# Patient Record
Sex: Female | Born: 1980 | Race: Black or African American | Hispanic: No | Marital: Married | Smoking: Never smoker
Health system: Southern US, Community
[De-identification: ages and names within clinical notes are randomized; demographics above are authoritative.]

## PROBLEM LIST (undated history)

## (undated) DIAGNOSIS — E0590 Thyrotoxicosis, unspecified without thyrotoxic crisis or storm: Secondary | ICD-10-CM

## (undated) DIAGNOSIS — M940 Chondrocostal junction syndrome [Tietze]: Secondary | ICD-10-CM

## (undated) DIAGNOSIS — M1990 Unspecified osteoarthritis, unspecified site: Secondary | ICD-10-CM

## (undated) DIAGNOSIS — E663 Overweight: Secondary | ICD-10-CM

## (undated) DIAGNOSIS — I471 Supraventricular tachycardia: Secondary | ICD-10-CM

## (undated) DIAGNOSIS — Z6827 Body mass index (BMI) 27.0-27.9, adult: Secondary | ICD-10-CM

## (undated) DIAGNOSIS — I4719 Other supraventricular tachycardia: Secondary | ICD-10-CM

## (undated) DIAGNOSIS — J45909 Unspecified asthma, uncomplicated: Secondary | ICD-10-CM

## (undated) DIAGNOSIS — R079 Chest pain, unspecified: Secondary | ICD-10-CM

## (undated) HISTORY — DX: Chest pain, unspecified: R07.9

## (undated) HISTORY — DX: Chondrocostal junction syndrome (tietze): M94.0

## (undated) HISTORY — DX: Supraventricular tachycardia: I47.1

## (undated) HISTORY — DX: Overweight: E66.3

## (undated) HISTORY — DX: Unspecified asthma, uncomplicated: J45.909

## (undated) HISTORY — DX: Body mass index (BMI) 27.0-27.9, adult: Z68.27

## (undated) HISTORY — DX: Other supraventricular tachycardia: I47.19

---

## 2002-11-11 ENCOUNTER — Ambulatory Visit (HOSPITAL_COMMUNITY): Admission: RE | Admit: 2002-11-11 | Discharge: 2002-11-11 | Payer: Self-pay | Admitting: Family Medicine

## 2002-11-11 ENCOUNTER — Encounter: Payer: Self-pay | Admitting: Family Medicine

## 2006-09-07 ENCOUNTER — Ambulatory Visit: Payer: Self-pay | Admitting: Urgent Care

## 2006-09-11 ENCOUNTER — Ambulatory Visit: Payer: Self-pay | Admitting: Gastroenterology

## 2006-10-23 ENCOUNTER — Ambulatory Visit: Payer: Self-pay | Admitting: Internal Medicine

## 2007-03-07 ENCOUNTER — Other Ambulatory Visit: Admission: RE | Admit: 2007-03-07 | Discharge: 2007-03-07 | Payer: Self-pay | Admitting: Obstetrics and Gynecology

## 2007-10-15 ENCOUNTER — Other Ambulatory Visit: Admission: RE | Admit: 2007-10-15 | Discharge: 2007-10-15 | Payer: Self-pay | Admitting: Obstetrics and Gynecology

## 2008-11-25 ENCOUNTER — Other Ambulatory Visit: Admission: RE | Admit: 2008-11-25 | Discharge: 2008-11-25 | Payer: Self-pay | Admitting: Obstetrics & Gynecology

## 2009-06-25 ENCOUNTER — Emergency Department (HOSPITAL_COMMUNITY): Admission: EM | Admit: 2009-06-25 | Discharge: 2009-06-25 | Payer: Self-pay | Admitting: Emergency Medicine

## 2009-11-30 ENCOUNTER — Ambulatory Visit (HOSPITAL_COMMUNITY): Admission: RE | Admit: 2009-11-30 | Discharge: 2009-11-30 | Payer: Self-pay | Admitting: Obstetrics & Gynecology

## 2009-11-30 ENCOUNTER — Other Ambulatory Visit: Admission: RE | Admit: 2009-11-30 | Discharge: 2009-11-30 | Payer: Self-pay | Admitting: Obstetrics and Gynecology

## 2010-02-23 LAB — HIV ANTIBODY (ROUTINE TESTING W REFLEX): HIV: NONREACTIVE

## 2010-04-19 LAB — GLUCOSE, CAPILLARY: Glucose-Capillary: 122 mg/dL — ABNORMAL HIGH (ref 70–99)

## 2010-06-15 NOTE — Assessment & Plan Note (Signed)
NAME:  Christy Harmon, Christy Harmon             CHART#:  [id number]   DATE:  09/07/2006                       DOB:  21-May-1980   REQUESTING PHYSICIAN:  Madelin Rear. Sherwood Gambler, M.D.   CHIEF COMPLAINT:  Abdominal pain/constipation.   HISTORY OF PRESENT ILLNESS:  Christy Harmon is a 30 year old female who  developed constipation about two months ago.  She tells me she went up  to a week without a bowel movement.  She was seen by Dr. Sherwood Gambler.  Was  started on stool softeners as well as increasing her intake of juice.  She tells me this has gotten much better.  She is now only going up to  every other day without a bowel movement.  She denies any rectal  bleeding or melena.  She does have tenderness to her entire abdomen.  She has intermittent twisting pain that she rates as a 6/10.  This is  usually intermittent.  It lasts until she eats.  She feels the pain is  relieved once she eats, then it returns in several hours.  She complains  of nausea but denies any vomiting.  She does have a history of heartburn  and indigestion.  She was having some odynophagia.  She resumed her  AcipHex 20 mg daily, and this seemed to resolve her GERD symptoms.  She  tells me occasionally she has loose stools after she eats where she has  to run to the bathroom.  Her weight is up 16 pounds in the last four  months.  She denies any dysphagia.   PAST MEDICAL/SURGICAL HISTORY:  Chronic GERD, seasonal allergies.   CURRENT MEDICATIONS:  1. AcipHex 20 mg daily.  2. Birth control pills, Ortho Tri-Cyclen once daily.  3. Stool softener daily.  4. Motrin rarely p.r.n.   ALLERGIES:  No known drug allergies.   FAMILY HISTORY:  No known family history of colorectal CA or chronic GI  problems.   SOCIAL HISTORY:  Christy Harmon is single.  She is engaged to be married in  October.  She lives with her parents currently.  She is employed with  [employer] full time.  She denies any tobacco or drug use.  She rarely  consumes alcohol.   REVIEW  OF SYSTEMS:  See HPI.  GYN:  Last menstrual period was the end of  the July.  She denies any problems in this arena.  She had a negative  home pregnancy test.   PHYSICAL EXAMINATION:  VITAL SIGNS:  Weight 114 pounds.  Height is 57  inches.  Temp 98.5, blood pressure 108/78.  Pulse 72.  GENERAL:  Christy Harmon is a well-developed and well-nourished female in  no acute distress.  HEENT:  Sclerae are clear and nonicteric.  Conjunctivae are pink.  Oropharynx pink and moist without any lesions.  NECK:  Supple without any mass or thyromegaly.  CHEST:  Heart regular rate and rhythm with normal S1 and S2 without  murmurs, clicks, rubs, or gallops.  LUNGS:  Clear to auscultation bilaterally.  ABDOMEN:  Positive bowel sounds x4.  No bruits auscultated.  Soft,  nontender, nondistended without palpable mass or hepatosplenomegaly.  No  rebound tenderness or guarding.  EXTREMITIES:  Without clubbing or edema bilaterally.  SKIN:  Pink, warm and dry without any rash or jaundice.  RECTAL:  No external lesions visualized.  Good sphincter  tone noted. No  internal masses palpated & a small amount of light brown stool was  obtained in the vault, which was hemoccult negative.   IMPRESSION:  Christy Harmon is a 30 year old female with history of  constipation about two months ago which responded to stool softeners and  increasing her dietary fiber.  She also has bouts of post-prandial loose  stools and abdominal pain along with some nausea.  I suspect she may  have irritable bowel syndrome.  There are no warning flags at this time.  She tells me she believes she was recently treated for H. pylori.  Will  follow up on this.  She does have a history of chronic gastroesophageal  reflux disease; however, her symptoms have responded to PPI.   PLAN:  1. Will request labs from [facility].  2. Hemoccult stools x3.  3. MiraLax 17 gm daily as needed for constipation.  4. Fiber supplement of choice.  We  have given her a sample of      Benefiber today.  5. Bentyl 10 mg q.a.c. and nightly up to q.i.d. p.r.n. diarrhea and      abdominal pain, #60 with one refill.  6. Continue AcipHex 20 mg daily.  7. GERD and IBS literature given for her review.  8. Office visit in six weeks with Dr. Jena Gauss.   We would like to thank Dr. Sherwood Gambler for allowing [place] to participate in the  care of Christy Harmon.       Lorenza Burton, N.P.  Electronically Signed     R. Roetta Sessions, M.D.  Electronically Signed   KJ/MEDQ  D:  09/07/2006  T:  09/07/2006  Job:  [id number]   cc:   Madelin Rear. Sherwood Gambler, MD

## 2010-06-15 NOTE — Assessment & Plan Note (Signed)
NAME:  Christy Harmon, Christy Harmon             CHART#:  [id number]   DATE:  10/23/2006                       DOB:  03/07/1980   Follow-up of abdominal pain and constipation.  The patient was seen by  [place] on September 07, 2006, for worsening constipation.  She responded nicely  to stool softeners, increasing her dietary fiber intake.  She has  intermittent bouts of diarrhea punctuating periods of constipation.  She  is stressed out with her upcoming marriage on November 04, 2006.  H.  Pylori serology was previously negative through [employer]. Sherwood Gambler,  M.D.'s office.  She has been hemoccult negative now a total of four  times.  She did take a brief course of Penta which helped the episodes  of diarrhea.  She is taking over-the-counter stool softener laxative  (query Peri-Colace) with excellent results once daily p.r.n.  She is  increasing her fluid intake, taking Fiber One cereal and really overall  doing very well.   CURRENT MEDICATIONS:  See updated list.   ALLERGIES:  No known drug allergies.   PHYSICAL EXAMINATION:  GENERAL:  She appears well.  VITAL SIGNS:  Weight 118, height 4 feet 11 inches, temperature 98, blood  pressure 102/74, pulse 78.  ABDOMEN:  Flat, soft, and nontender without appreciable mass or  organomegaly.   ASSESSMENT:  1. The patient has irritable bowel syndrome, constipation predominant.      The chronic but benign nature of this entity, recommendations      continue fiber supplement, adequate fluids everyday, may use stool      softener/laxative over-the-counter as needed.  2. She was reassured with follow-up here on a p.r.n. basis.       Jonathon Bellows, M.D.  Electronically Signed     RMR/MEDQ  D:  10/23/2006  T:  10/23/2006  Job:  [id number]   cc:   Madelin Rear. Sherwood Gambler, MD

## 2010-07-01 ENCOUNTER — Inpatient Hospital Stay (HOSPITAL_COMMUNITY)
Admission: AD | Admit: 2010-07-01 | Discharge: 2010-07-01 | Disposition: A | Discharge status: Home / Self Care | Payer: Managed Care, Other (non HMO) | Source: Ambulatory Visit | Attending: Obstetrics & Gynecology | Admitting: Obstetrics & Gynecology

## 2010-07-01 ENCOUNTER — Inpatient Hospital Stay (HOSPITAL_COMMUNITY): Payer: Managed Care, Other (non HMO)

## 2010-07-01 DIAGNOSIS — E079 Disorder of thyroid, unspecified: Secondary | ICD-10-CM

## 2010-07-01 DIAGNOSIS — O24919 Unspecified diabetes mellitus in pregnancy, unspecified trimester: Secondary | ICD-10-CM | POA: Insufficient documentation

## 2010-07-01 DIAGNOSIS — E0590 Thyrotoxicosis, unspecified without thyrotoxic crisis or storm: Secondary | ICD-10-CM | POA: Insufficient documentation

## 2010-07-01 DIAGNOSIS — O368190 Decreased fetal movements, unspecified trimester, not applicable or unspecified: Secondary | ICD-10-CM

## 2010-07-01 DIAGNOSIS — E119 Type 2 diabetes mellitus without complications: Secondary | ICD-10-CM | POA: Insufficient documentation

## 2010-07-01 DIAGNOSIS — O99280 Endocrine, nutritional and metabolic diseases complicating pregnancy, unspecified trimester: Secondary | ICD-10-CM

## 2010-07-03 LAB — STREP B DNA PROBE

## 2010-08-21 ENCOUNTER — Encounter (HOSPITAL_COMMUNITY): Payer: Self-pay | Admitting: *Deleted

## 2010-08-21 ENCOUNTER — Encounter (HOSPITAL_COMMUNITY): Payer: Self-pay | Admitting: Anesthesiology

## 2010-08-21 ENCOUNTER — Inpatient Hospital Stay (HOSPITAL_COMMUNITY): Admission: RE | Admit: 2010-08-21 | Payer: PRIVATE HEALTH INSURANCE | Source: Ambulatory Visit

## 2010-08-21 ENCOUNTER — Inpatient Hospital Stay (HOSPITAL_COMMUNITY)
Admission: AD | Admit: 2010-08-21 | Discharge: 2010-08-25 | DRG: 766 | Disposition: A | Discharge status: Home / Self Care | Payer: Managed Care, Other (non HMO) | Source: Ambulatory Visit | Attending: Obstetrics & Gynecology | Admitting: Obstetrics & Gynecology

## 2010-08-21 ENCOUNTER — Inpatient Hospital Stay (HOSPITAL_COMMUNITY): Payer: Managed Care, Other (non HMO) | Admitting: Anesthesiology

## 2010-08-21 DIAGNOSIS — E079 Disorder of thyroid, unspecified: Secondary | ICD-10-CM | POA: Diagnosis present

## 2010-08-21 DIAGNOSIS — O99814 Abnormal glucose complicating childbirth: Secondary | ICD-10-CM | POA: Diagnosis present

## 2010-08-21 DIAGNOSIS — O339 Maternal care for disproportion, unspecified: Secondary | ICD-10-CM | POA: Diagnosis present

## 2010-08-21 DIAGNOSIS — O330 Maternal care for disproportion due to deformity of maternal pelvic bones: Principal | ICD-10-CM | POA: Diagnosis present

## 2010-08-21 DIAGNOSIS — Z98891 History of uterine scar from previous surgery: Secondary | ICD-10-CM

## 2010-08-21 DIAGNOSIS — E0590 Thyrotoxicosis, unspecified without thyrotoxic crisis or storm: Secondary | ICD-10-CM | POA: Diagnosis present

## 2010-08-21 HISTORY — DX: Thyrotoxicosis, unspecified without thyrotoxic crisis or storm: E05.90

## 2010-08-21 LAB — COMPREHENSIVE METABOLIC PANEL
AST: 19 U/L (ref 0–37)
Albumin: 2.6 g/dL — ABNORMAL LOW (ref 3.5–5.2)
Chloride: 105 mEq/L (ref 96–112)
Creatinine, Ser: 0.86 mg/dL (ref 0.50–1.10)
Potassium: 3.9 mEq/L (ref 3.5–5.1)
Total Bilirubin: 0.3 mg/dL (ref 0.3–1.2)
Total Protein: 6.8 g/dL (ref 6.0–8.3)

## 2010-08-21 LAB — CBC
MCH: 31.4 pg (ref 26.0–34.0)
Platelets: 178 10*3/uL (ref 150–400)
RBC: 4.36 MIL/uL (ref 3.87–5.11)
WBC: 10.1 10*3/uL (ref 4.0–10.5)

## 2010-08-21 LAB — TYPE AND SCREEN: Antibody Screen: NEGATIVE

## 2010-08-21 LAB — GLUCOSE, CAPILLARY: Glucose-Capillary: 75 mg/dL (ref 70–99)

## 2010-08-21 LAB — PROTEIN / CREATININE RATIO, URINE: Total Protein, Urine: 4.2 mg/dL

## 2010-08-21 MED ORDER — OXYTOCIN 20 UNITS IN LACTATED RINGERS INFUSION - SIMPLE
1.0000 m[IU]/min | INTRAVENOUS | Status: DC
  Administered 2010-08-21: 1 m[IU]/min via INTRAVENOUS

## 2010-08-21 MED ORDER — LIDOCAINE HCL (PF) 1 % IJ SOLN
30.0000 mL | INTRAMUSCULAR | Status: DC | PRN
  Filled 2010-08-21: qty 30

## 2010-08-21 MED ORDER — OXYCODONE-ACETAMINOPHEN 5-325 MG PO TABS: 2.0000 | ORAL_TABLET | ORAL | Status: DC | PRN

## 2010-08-21 MED ORDER — EPHEDRINE 5 MG/ML INJ: 10.0000 mg | INTRAVENOUS | Status: DC | PRN

## 2010-08-21 MED ORDER — FENTANYL 2.5 MCG/ML BUPIVACAINE 1/10 % EPIDURAL INFUSION (WH - ANES)
14.0000 mL/h | INTRAMUSCULAR | Status: DC
  Administered 2010-08-21 – 2010-08-22 (×4): 14 mL/h via EPIDURAL
  Filled 2010-08-21 (×6): qty 60

## 2010-08-21 MED ORDER — CITRIC ACID-SODIUM CITRATE 334-500 MG/5ML PO SOLN
30.0000 mL | ORAL | Status: DC | PRN
  Administered 2010-08-22 (×2): 30 mL via ORAL
  Filled 2010-08-21: qty 30

## 2010-08-21 MED ORDER — PHENYLEPHRINE 40 MCG/ML (10ML) SYRINGE FOR IV PUSH (FOR BLOOD PRESSURE SUPPORT): 80.0000 ug | PREFILLED_SYRINGE | INTRAVENOUS | Status: DC | PRN

## 2010-08-21 MED ORDER — EPHEDRINE 5 MG/ML INJ
10.0000 mg | INTRAVENOUS | Status: DC | PRN
  Filled 2010-08-21: qty 4

## 2010-08-21 MED ORDER — OXYTOCIN 20 UNITS IN LACTATED RINGERS INFUSION - SIMPLE
125.0000 mL/h | Freq: Once | INTRAVENOUS | Status: DC
  Filled 2010-08-21 (×2): qty 1000

## 2010-08-21 MED ORDER — FENTANYL 2.5 MCG/ML BUPIVACAINE 1/10 % EPIDURAL INFUSION (WH - ANES)
INTRAMUSCULAR | Status: DC | PRN
  Administered 2010-08-21: 14 mL/h via EPIDURAL

## 2010-08-21 MED ORDER — TERBUTALINE SULFATE 1 MG/ML IJ SOLN: 0.2500 mg | Freq: Once | INTRAMUSCULAR | Status: AC | PRN

## 2010-08-21 MED ORDER — IBUPROFEN 600 MG PO TABS: 600.0000 mg | ORAL_TABLET | Freq: Four times a day (QID) | ORAL | Status: DC | PRN

## 2010-08-21 MED ORDER — FLEET ENEMA 7-19 GM/118ML RE ENEM: 1.0000 | ENEMA | RECTAL | Status: DC | PRN

## 2010-08-21 MED ORDER — LACTATED RINGERS IV SOLN
500.0000 mL | INTRAVENOUS | Status: DC | PRN
  Administered 2010-08-22 (×3): via INTRAVENOUS

## 2010-08-21 MED ORDER — LACTATED RINGERS IV SOLN
500.0000 mL | Freq: Once | INTRAVENOUS | Status: AC
  Administered 2010-08-21: 1000 mL via INTRAVENOUS

## 2010-08-21 MED ORDER — ONDANSETRON HCL 4 MG/2ML IJ SOLN
4.0000 mg | Freq: Four times a day (QID) | INTRAMUSCULAR | Status: DC | PRN
  Administered 2010-08-22 (×2): 4 mg via INTRAVENOUS
  Filled 2010-08-21 (×3): qty 2

## 2010-08-21 MED ORDER — ACETAMINOPHEN 325 MG PO TABS: 650.0000 mg | ORAL_TABLET | ORAL | Status: DC | PRN

## 2010-08-21 MED ORDER — LACTATED RINGERS IV SOLN
INTRAVENOUS | Status: DC
  Administered 2010-08-21 – 2010-08-22 (×2): via INTRAVENOUS

## 2010-08-21 MED ORDER — PHENYLEPHRINE 40 MCG/ML (10ML) SYRINGE FOR IV PUSH (FOR BLOOD PRESSURE SUPPORT)
80.0000 ug | PREFILLED_SYRINGE | INTRAVENOUS | Status: DC | PRN
  Filled 2010-08-21: qty 5

## 2010-08-21 MED ORDER — DIPHENHYDRAMINE HCL 50 MG/ML IJ SOLN: 12.5000 mg | INTRAMUSCULAR | Status: DC | PRN

## 2010-08-21 NOTE — Progress Notes (Signed)
Christy Harmon is a 30 y.o. G1P0 at [redacted]w[redacted]d by ultrasound admitted for induction of labor due to Gestational diabetes.  Subjective:   Objective: BP 128/69  Pulse 96  Temp(Src) 99 F (37.2 C) (Oral)  Resp 18  Ht 4\' 7"  (1.397 m)  Wt 152 lb (68.947 kg)  BMI 35.33 kg/m2  SpO2 92%      FHT:  FHR: 145 bpm, variability: moderate,  accelerations:  Present,  decelerations:  Absent UC:   irregular, every 3-7 minutes SVE:   Dilation: 4 Effacement (%): 80 Station: -2 Exam by:: Penley, RN   Labs: Lab Results  Component Value Date   WBC 10.1 08/21/2010   HGB 13.7 08/21/2010   HCT 40.1 08/21/2010   MCV 92.0 08/21/2010   PLT 178 08/21/2010    Assessment / Plan: IOL for GDM/A2. Just got her epidural and pitocin has not been started yet per patient's request to start it after epidural in place. Will recheck patient in approximately 2 hours or prn.  Fetal Wellbeing:  Category I Pain Control:  Epidural I/D:  n/a  Remona Boom N 08/21/2010, 10:32 PM

## 2010-08-21 NOTE — Plan of Care (Signed)
Problem: Consults Goal: Birthing Suites Patient Information Press F2 to bring up selections list   Pt 37-[redacted] weeks EGA     

## 2010-08-21 NOTE — H&P (Addendum)
Christy Harmon is a 30 y.o. female presenting for IOL of labor for GDM/A2. Pt has received care at [facility] and IOL was scheduled last week. Pt had [place] last week, and EFW was 7 lbs 7 oz. Pt has hisotry of hyperthyroidism and is on PTU, developed GDM and takes glyburide once a day. Pt desires for a epidural. Pt wants to breastfeed, and has not decided on birth control for postpartum yet.   Maternal Medical History:  Contractions: Frequency: irregular.    Fetal activity: Perceived fetal activity is normal.   Last perceived fetal movement was within the past hour.    Prenatal complications: HIV, preterm labor, substance abuse, thrombocytopenia and thrombophilia.   No bleeding, cholelithiasis, hypertension, infection, IUGR, nephrolithiasis, oligohydramnios, placental abnormality, polyhydramnios or pre-eclampsia.   Gestational Diabetes - A2 on glyburide  Prenatal Complications - Diabetes: gestational. Diabetes is managed by oral agent (monotherapy).      OB History    Grav Para Term Preterm Abortions TAB SAB Ect Mult Living   1              Past Medical History  Diagnosis Date  . Hypothyroidism   . Preterm labor   . Diabetes mellitus    History reviewed. No pertinent past surgical history. Family History: family history includes Diabetes in her mother. Social History:  reports that she has never smoked. She has never used smokeless tobacco. She reports that she does not drink alcohol or use illicit drugs.  Review of Systems  Constitutional: Negative.   HENT: Negative.   Eyes: Negative.   Respiratory: Negative.   Cardiovascular: Negative.   Gastrointestinal: Negative.   Genitourinary: Negative.   Musculoskeletal: Negative.   Skin: Negative.   Neurological: Negative.   Endo/Heme/Allergies: Negative.   Psychiatric/Behavioral: Negative.     Dilation: 4 Effacement (%): 80 Station: -2 Exam by:: Aliveah Gallant Blood pressure 142/96, pulse 99, temperature 99.1 F (37.3 C),  temperature source Oral, resp. rate 18, height 4\' 7"  (1.397 m), weight 152 lb (68.947 kg). Maternal Exam:  Abdomen: Patient reports no abdominal tenderness. Fundal height is 40 cm.   Estimated fetal weight is 8 pounds.   Fetal presentation: vertex  Introitus: Normal vulva. Normal vagina.    Fetal Exam Fetal Monitor Review: Mode: ultrasound.   Baseline rate: 145-155.  Variability: moderate (6-25 bpm).   Pattern: accelerations present and no decelerations.    Fetal State Assessment: Category I - tracings are normal.     Physical Exam  Constitutional: She is oriented to person, place, and time. She appears well-developed and well-nourished. No distress.  HENT:  Head: Normocephalic and atraumatic.  Eyes: Pupils are equal, round, and reactive to light.  Neck: Normal range of motion.  Cardiovascular: Normal rate, regular rhythm, normal heart sounds and intact distal pulses.  Exam reveals no gallop and no friction rub.   No murmur heard. Respiratory: Effort normal and breath sounds normal. No respiratory distress. She has no wheezes. She has no rales. She exhibits no tenderness.  GI: Soft. Bowel sounds are normal.  Neurological: She is alert and oriented to person, place, and time. She has normal reflexes. She displays normal reflexes. No cranial nerve deficit. Coordination normal.  Skin: Skin is warm and dry. No rash noted. She is not diaphoretic.  Psychiatric: She has a normal mood and affect.  Dilation: 4 Effacement (%): 80 Cervical Position: Middle Station: -2 Presentation: Vertex Exam by:: Kayla Deshaies    Prenatal labs: ABO, Rh:  O + Antibody: Negative (  07/21 0000) Rubella:  Immune RPR: Nonreactive (01/10 0000)  HBsAg: Negative (01/10 0000)  HIV: Non-reactive (07/21 0000)  GBS: NEGATIVE (05/31 1438)  GC/Ch neg/neg  Assessment/Plan: Admit for IOL for GDM. Glucomander will be ordered. Will start pitocin for induction. GBS neg so no current indication for antibiotics. Will  AROM when appropriate.Due to elevate BP on admission, will obtain CMP and urine protein/creatine ration to evaluate for risk of PreE, otherwise will monitor BP very closely and treat as appropriate.  Chancy Milroy 08/21/2010, 8:29 PM

## 2010-08-21 NOTE — Plan of Care (Signed)
Problem: Consults Goal: Lactation Consult Initiated if indicated Outcome: Completed/Met Date Met:  08/21/10 Pt bottle feeding only

## 2010-08-21 NOTE — Anesthesia Procedure Notes (Addendum)
Epidural Patient location during procedure: OB Start time: 08/21/2010 9:35 PM End time: 08/21/2010 9:42 PM Reason for block: procedure for pain  Staffing Anesthesiologist: Sandrea Hughs Performed by: anesthesiologist   Preanesthetic Checklist Completed: patient identified, site marked, surgical consent, pre-op evaluation, timeout performed, IV checked, risks and benefits discussed and monitors and equipment checked  Epidural Patient position: sitting Prep: site prepped and draped and DuraPrep Patient monitoring: continuous pulse ox and blood pressure Approach: midline Injection technique: LOR air  Needle:  Needle type: Tuohy  Needle gauge: 17 G Needle length: 9 cm Needle insertion depth: 4 cm Catheter type: closed end flexible Catheter size: 19 Gauge Catheter at skin depth: 10 and 9 cm Test dose: negative and 1.5% lidocaine  Assessment Sensory level: T8 Events: blood not aspirated, injection not painful, no injection resistance, negative IV test and no paresthesia  Additional Notes Pt comfortable. See nursing notes for VS and FHR

## 2010-08-21 NOTE — Anesthesia Preprocedure Evaluation (Signed)
Anesthesia Evaluation  Name, MR# and DOB Patient awake  General Assessment Comment  Reviewed: Allergy & Precautions, H&P  and Patient's Chart, lab work & pertinent test results  Airway Mallampati: II TM Distance: >3 FB Neck ROM: full    Dental No notable dental hx    Pulmonaryneg pulmonary ROS      pulmonary exam normal   Cardiovascular regular Normal   Neuro/PsychNegative Neurological ROS Negative Psych ROS  GI/Hepatic/Renal negative GI ROS, negative Liver ROS, and negative Renal ROS (+)       Endo/Other  Negative Endocrine ROS (+)   Abdominal   Musculoskeletal  Hematology negative hematology ROS (+)   Peds  Reproductive/Obstetrics (+) Pregnancy   Anesthesia Other Findings             Anesthesia Physical Anesthesia Plan  ASA: II  Anesthesia Plan: Epidural   Post-op Pain Management:    Induction:   Airway Management Planned:   Additional Equipment:   Intra-op Plan:   Post-operative Plan:   Informed Consent: I have reviewed the patients History and Physical, chart, labs and discussed the procedure including the risks, benefits and alternatives for the proposed anesthesia with the patient or authorized representative who has indicated his/her understanding and acceptance.     Plan Discussed with:   Anesthesia Plan Comments:         Anesthesia Quick Evaluation  

## 2010-08-21 NOTE — Plan of Care (Signed)
Problem: Consults Goal: Birthing Suites Patient Information Press F2 to bring up selections list  Outcome: Completed/Met Date Met:  08/21/10  Pt 37-[redacted] weeks EGA, Inpatient induction and Diabetic

## 2010-08-22 ENCOUNTER — Inpatient Hospital Stay (HOSPITAL_COMMUNITY): Admission: RE | Admit: 2010-08-22 | Payer: PRIVATE HEALTH INSURANCE | Source: Ambulatory Visit

## 2010-08-22 ENCOUNTER — Encounter (HOSPITAL_COMMUNITY): Payer: Self-pay | Admitting: Obstetrics and Gynecology

## 2010-08-22 ENCOUNTER — Encounter (HOSPITAL_COMMUNITY)
Admission: AD | Disposition: A | Discharge status: Home / Self Care | Payer: Self-pay | Source: Ambulatory Visit | Attending: Obstetrics & Gynecology

## 2010-08-22 ENCOUNTER — Encounter (HOSPITAL_COMMUNITY): Payer: Self-pay | Admitting: Anesthesiology

## 2010-08-22 DIAGNOSIS — O339 Maternal care for disproportion, unspecified: Secondary | ICD-10-CM | POA: Clinically undetermined

## 2010-08-22 LAB — GLUCOSE, CAPILLARY
Glucose-Capillary: 69 mg/dL — ABNORMAL LOW (ref 70–99)
Glucose-Capillary: 172 mg/dL — ABNORMAL HIGH (ref 70–99)
Glucose-Capillary: 77 mg/dL (ref 70–99)
Glucose-Capillary: 59 mg/dL — ABNORMAL LOW (ref 70–99)
Glucose-Capillary: 189 mg/dL — ABNORMAL HIGH (ref 70–99)

## 2010-08-22 LAB — RPR: RPR Ser Ql: NONREACTIVE

## 2010-08-22 SURGERY — Surgical Case
Anesthesia: Epidural | Site: Abdomen | Wound class: Clean Contaminated

## 2010-08-22 SURGERY — Surgical Case
Anesthesia: Regional

## 2010-08-22 MED ORDER — DEXTROSE 50 % IV SOLN
INTRAVENOUS | Status: DC | PRN
  Administered 2010-08-22: 12 g via INTRAVENOUS
  Administered 2010-08-22: 13 g via INTRAVENOUS

## 2010-08-22 MED ORDER — OXYTOCIN 10 UNIT/ML IJ SOLN
INTRAMUSCULAR | Status: AC
  Filled 2010-08-22: qty 2

## 2010-08-22 MED ORDER — ONDANSETRON HCL 4 MG/2ML IJ SOLN
INTRAMUSCULAR | Status: AC
  Filled 2010-08-22: qty 2

## 2010-08-22 MED ORDER — KETOROLAC TROMETHAMINE 30 MG/ML IJ SOLN
INTRAMUSCULAR | Status: AC
  Administered 2010-08-22: 30 mg via INTRAVENOUS
  Filled 2010-08-22: qty 1

## 2010-08-22 MED ORDER — MEPERIDINE HCL 25 MG/ML IJ SOLN: 6.2500 mg | INTRAMUSCULAR | Status: DC | PRN

## 2010-08-22 MED ORDER — SODIUM BICARBONATE 8.4 % IV SOLN
INTRAVENOUS | Status: AC
  Filled 2010-08-22: qty 50

## 2010-08-22 MED ORDER — EPHEDRINE 5 MG/ML INJ
INTRAVENOUS | Status: AC
  Filled 2010-08-22: qty 10

## 2010-08-22 MED ORDER — OXYTOCIN 20 UNITS IN LACTATED RINGERS INFUSION - SIMPLE
1.0000 m[IU]/min | INTRAVENOUS | Status: DC
  Administered 2010-08-22: 14 m[IU]/min via INTRAVENOUS

## 2010-08-22 MED ORDER — GENTAMICIN IN SALINE 1.6-0.9 MG/ML-% IV SOLN
INTRAVENOUS | Status: DC | PRN
  Administered 2010-08-22: 100 mg via INTRAVENOUS

## 2010-08-22 MED ORDER — OXYTOCIN 10 UNIT/ML IJ SOLN
INTRAMUSCULAR | Status: AC
  Filled 2010-08-22: qty 3

## 2010-08-22 MED ORDER — LIDOCAINE-EPINEPHRINE (PF) 2 %-1:200000 IJ SOLN
INTRAMUSCULAR | Status: AC
  Filled 2010-08-22: qty 20

## 2010-08-22 MED ORDER — LIDOCAINE-EPINEPHRINE (PF) 2 %-1:200000 IJ SOLN
INTRAMUSCULAR | Status: DC | PRN
  Administered 2010-08-22: 3 mL via EPIDURAL

## 2010-08-22 MED ORDER — MEPERIDINE HCL 25 MG/ML IJ SOLN
INTRAMUSCULAR | Status: AC
  Administered 2010-08-22: 12.5 mg via INTRAVASCULAR
  Filled 2010-08-22: qty 1

## 2010-08-22 MED ORDER — KETOROLAC TROMETHAMINE 30 MG/ML IJ SOLN
30.0000 mg | Freq: Four times a day (QID) | INTRAMUSCULAR | Status: AC | PRN
  Administered 2010-08-22: 30 mg via INTRAVENOUS

## 2010-08-22 MED ORDER — SCOPOLAMINE 1 MG/3DAYS TD PT72: 1.0000 | MEDICATED_PATCH | Freq: Once | TRANSDERMAL | Status: DC

## 2010-08-22 MED ORDER — KETOROLAC TROMETHAMINE 30 MG/ML IJ SOLN: 30.0000 mg | Freq: Four times a day (QID) | INTRAMUSCULAR | Status: AC | PRN

## 2010-08-22 MED ORDER — AMPICILLIN SODIUM 2 G IJ SOLR
2.0000 g | Freq: Once | INTRAMUSCULAR | Status: DC
  Filled 2010-08-22: qty 2000

## 2010-08-22 MED ORDER — FENTANYL CITRATE 0.05 MG/ML IJ SOLN
INTRAMUSCULAR | Status: AC
  Filled 2010-08-22: qty 2

## 2010-08-22 MED ORDER — OXYTOCIN 10 UNIT/ML IJ SOLN
INTRAMUSCULAR | Status: DC | PRN
  Administered 2010-08-22 (×2): 20 [IU] via INTRAMUSCULAR

## 2010-08-22 MED ORDER — MORPHINE SULFATE 0.5 MG/ML IJ SOLN
INTRAMUSCULAR | Status: AC
  Filled 2010-08-22: qty 10

## 2010-08-22 MED ORDER — DEXTROSE 50 % IV SOLN
INTRAVENOUS | Status: AC
  Filled 2010-08-22: qty 50

## 2010-08-22 MED ORDER — SCOPOLAMINE 1 MG/3DAYS TD PT72
MEDICATED_PATCH | TRANSDERMAL | Status: AC
  Filled 2010-08-22: qty 1

## 2010-08-22 MED ORDER — MORPHINE SULFATE (PF) 0.5 MG/ML IJ SOLN
INTRAMUSCULAR | Status: DC | PRN
  Administered 2010-08-22: 4 mg via EPIDURAL
  Administered 2010-08-22: 1 mg via INTRAVENOUS

## 2010-08-22 MED ORDER — DEXTROSE 50 % IV SOLN
25.0000 mL | Freq: Once | INTRAVENOUS | Status: AC
  Administered 2010-08-22: 25 mL via INTRAVENOUS

## 2010-08-22 MED ORDER — ONDANSETRON HCL 4 MG/2ML IJ SOLN
INTRAMUSCULAR | Status: DC | PRN
  Administered 2010-08-22: 4 mg via INTRAVENOUS

## 2010-08-22 MED ORDER — AMPICILLIN SODIUM 2 G IJ SOLR
INTRAMUSCULAR | Status: DC | PRN
  Administered 2010-08-22: 2 g via INTRAVENOUS

## 2010-08-22 MED ORDER — SODIUM CHLORIDE 0.9 % IV SOLN
2.0000 g | Freq: Once | INTRAVENOUS | Status: DC
  Filled 2010-08-22: qty 2000

## 2010-08-22 MED ORDER — CITRIC ACID-SODIUM CITRATE 334-500 MG/5ML PO SOLN
ORAL | Status: AC
  Administered 2010-08-22: 30 mL via ORAL
  Filled 2010-08-22: qty 15

## 2010-08-22 MED ORDER — AMPICILLIN SODIUM 2 G IJ SOLR: 2.0000 g | Freq: Four times a day (QID) | INTRAMUSCULAR | Status: DC

## 2010-08-22 MED ORDER — FENTANYL CITRATE 0.05 MG/ML IJ SOLN
INTRAMUSCULAR | Status: DC | PRN
  Administered 2010-08-22 (×4): 25 ug via INTRAVENOUS

## 2010-08-22 SURGICAL SUPPLY — 29 items
APL SKNCLS STERI-STRIP NONHPOA (GAUZE/BANDAGES/DRESSINGS)
BENZOIN TINCTURE PRP APPL 2/3 (GAUZE/BANDAGES/DRESSINGS) IMPLANT
CLOTH BEACON ORANGE TIMEOUT ST (SAFETY) ×2 IMPLANT
ELECT REM PT RETURN 9FT ADLT (ELECTROSURGICAL) ×2
ELECTRODE REM PT RTRN 9FT ADLT (ELECTROSURGICAL) ×1 IMPLANT
EXTRACTOR VACUUM KIWI (MISCELLANEOUS) IMPLANT
GLOVE BIO SURGEON ST LM GN SZ9 (GLOVE) ×2 IMPLANT
GLOVE BIOGEL PI IND STRL 7.0 (GLOVE) IMPLANT
GLOVE BIOGEL PI IND STRL 9 (GLOVE) ×2 IMPLANT
GLOVE BIOGEL PI INDICATOR 7.0 (GLOVE) ×1
GLOVE BIOGEL PI INDICATOR 9 (GLOVE) ×2
GOWN BRE IMP SLV AUR LG STRL (GOWN DISPOSABLE) ×2 IMPLANT
GOWN BRE IMP SLV AUR XL STRL (GOWN DISPOSABLE) ×2 IMPLANT
GOWN STRL REIN 3XL LVL4 (GOWN DISPOSABLE) ×2 IMPLANT
NEEDLE HYPO 25X5/8 SAFETYGLIDE (NEEDLE) IMPLANT
NS IRRIG 1000ML POUR BTL (IV SOLUTION) ×2 IMPLANT
PACK C SECTION WH (CUSTOM PROCEDURE TRAY) ×2 IMPLANT
RTRCTR C-SECT PINK 25CM LRG (MISCELLANEOUS) IMPLANT
SLEEVE SCD COMPRESS KNEE MED (MISCELLANEOUS) ×1 IMPLANT
STRIP CLOSURE SKIN 1/2X4 (GAUZE/BANDAGES/DRESSINGS) IMPLANT
SUT CHROMIC 0 CTX 36 (SUTURE) ×4 IMPLANT
SUT VIC AB 0 CT1 27 (SUTURE) ×2
SUT VIC AB 0 CT1 27XBRD ANBCTR (SUTURE) ×1 IMPLANT
SUT VIC AB 2-0 CT1 27 (SUTURE) ×6
SUT VIC AB 2-0 CT1 TAPERPNT 27 (SUTURE) ×2 IMPLANT
SUT VIC AB 4-0 KS 27 (SUTURE) ×2 IMPLANT
SYR BULB IRRIGATION 50ML (SYRINGE) IMPLANT
TRAY FOLEY CATH 14FR (SET/KITS/TRAYS/PACK) ×1 IMPLANT
WATER STERILE IRR 1000ML POUR (IV SOLUTION) ×2 IMPLANT

## 2010-08-22 SURGICAL SUPPLY — 27 items
APL SKNCLS STERI-STRIP NONHPOA (GAUZE/BANDAGES/DRESSINGS)
BENZOIN TINCTURE PRP APPL 2/3 (GAUZE/BANDAGES/DRESSINGS) IMPLANT
CLOTH BEACON ORANGE TIMEOUT ST (SAFETY) ×1 IMPLANT
ELECT REM PT RETURN 9FT ADLT (ELECTROSURGICAL)
ELECTRODE REM PT RTRN 9FT ADLT (ELECTROSURGICAL) ×1 IMPLANT
EXTRACTOR VACUUM KIWI (MISCELLANEOUS) IMPLANT
GLOVE BIO SURGEON ST LM GN SZ9 (GLOVE) ×1 IMPLANT
GLOVE BIOGEL PI IND STRL 9 (GLOVE) ×2 IMPLANT
GLOVE BIOGEL PI INDICATOR 9 (GLOVE)
GOWN BRE IMP SLV AUR LG STRL (GOWN DISPOSABLE) IMPLANT
GOWN BRE IMP SLV AUR XL STRL (GOWN DISPOSABLE) ×1 IMPLANT
GOWN STRL REIN 3XL LVL4 (GOWN DISPOSABLE) ×1 IMPLANT
NEEDLE HYPO 25X5/8 SAFETYGLIDE (NEEDLE) IMPLANT
NS IRRIG 1000ML POUR BTL (IV SOLUTION) ×1 IMPLANT
PACK C SECTION WH (CUSTOM PROCEDURE TRAY) ×1 IMPLANT
RTRCTR C-SECT PINK 25CM LRG (MISCELLANEOUS) IMPLANT
SLEEVE SCD COMPRESS KNEE MED (MISCELLANEOUS) IMPLANT
STRIP CLOSURE SKIN 1/2X4 (GAUZE/BANDAGES/DRESSINGS) IMPLANT
SUT CHROMIC 0 CTX 36 (SUTURE) ×2 IMPLANT
SUT VIC AB 0 CT1 27 (SUTURE)
SUT VIC AB 0 CT1 27XBRD ANBCTR (SUTURE) ×1 IMPLANT
SUT VIC AB 2-0 CT1 27 (SUTURE)
SUT VIC AB 2-0 CT1 TAPERPNT 27 (SUTURE) IMPLANT
SUT VIC AB 4-0 KS 27 (SUTURE) ×1 IMPLANT
SYR BULB IRRIGATION 50ML (SYRINGE) IMPLANT
TRAY FOLEY CATH 14FR (SET/KITS/TRAYS/PACK) ×1 IMPLANT
WATER STERILE IRR 1000ML POUR (IV SOLUTION) ×1 IMPLANT

## 2010-08-22 NOTE — Brief Op Note (Signed)
08/21/2010 - 08/22/2010  9:49 PM  PATIENT:  Christy Harmon  30 y.o. female  PRE-OPERATIVE DIAGNOSIS:  cephlapelvic disproportion  POST-OPERATIVE DIAGNOSIS:  cephlapelvic disproportion  PROCEDURE:  Procedure(s): CESAREAN SECTION  SURGEON:  Surgeon(s): Tilda Burrow, MD  PHYSICIAN ASSISTANT:   ASSISTANTS: none   ANESTHESIA:   epidural  ESTIMATED BLOOD LOSS:800 *   BLOOD ADMINISTERED:none  DRAINS: Urinary Catheter (Foley)   LOCAL MEDICATIONS USED:  NONE  SPECIMEN:  No Specimen  DISPOSITION OF SPECIMEN:  l & d  COUNTS:  YES  TOURNIQUET:  * No tourniquets in log *  DICTATION #:[id number]  PLAN OF CARE: routine postpartum  PATIENT DISPOSITION:  PACU - hemodynamically stable.   Delay start of Pharmacological VTE agent (>24hrs) due to surgical blood loss or risk of bleeding:  not applicable

## 2010-08-22 NOTE — Progress Notes (Signed)
Christy Harmon is a 30 y.o. G1P0 at [redacted]w[redacted]d by LMP admitted for induction of labor due to Gestational diabetes.  Subjective: Patient is comfortable with epidural    Pitocin is at 8 mU/min, fhr cat I contractions adequate  Pt has NOT made adequate progress over the last 4 hours.  When examined during a contraction there is no descent of the vertex. Some moulding noted   Objective: BP 139/82  Pulse 126  Temp(Src) 98.8 F (37.1 C) (Oral)  Resp 20  Ht 4\' 7"  (1.397 m)  Wt 68.947 kg (152 lb)  BMI 35.33 kg/m2  SpO2 92%      FHT:  FHR: 140's bpm, variability: moderate,  accelerations:  Present,  decelerations:  Absent UC:   regular, every 3 minutes SVE:   Dilation: 8.5 Effacement (%): 100 Station: 0;-1 Exam by:: Dr Emelda Fear  Labs: Lab Results  Component Value Date   WBC 10.1 08/21/2010   HGB 13.7 08/21/2010   HCT 40.1 08/21/2010   MCV 92.0 08/21/2010   PLT 178 08/21/2010    Assessment / Plan: Induction of labor due to gestational hypertension,  progressing well on pitocin c suboptimal progress x 4 hours  Labor: IUPC placed to confirm adequacy of labor; If labor is optimal and no progress in 2 hours will recommend Cesarean delivery Preeclampsia:  n/a Fetal Wellbeing:  Category I Pain Control:  Epidural I/D:  n/a Anticipated MOD:  Probable Cesarean  Christy Harmon 08/22/2010, 4:12 PM

## 2010-08-22 NOTE — Anesthesia Postprocedure Evaluation (Signed)
  Anesthesia Post-op Note  Patient: Christy Harmon  Procedure(s) Performed:  CESAREAN SECTION - primary   Patient's cardiopulmonary status is stable Patient's level of consciousness: sedate but responsive verbally Pain and nausea are all reasonably controlled No anesthetic complications apparent at this time No follow up care necessary at this time  

## 2010-08-22 NOTE — Transfer of Care (Signed)
Immediate Anesthesia Transfer of Care Note  Patient: Christy Harmon  Procedure(s) Performed:  CESAREAN SECTION - primary   Patient Location: PACU  Anesthesia Type: Epidural  Level of Consciousness: awake, alert  and oriented  Airway & Oxygen Therapy: Patient Spontanous Breathing  Post-op Assessment: Report given to PACU RN and Post -op Vital signs reviewed and stable  Post vital signs: Reviewed and stable  Complications: No apparent anesthesia complications

## 2010-08-22 NOTE — Progress Notes (Signed)
Dr Emelda Fear at bedside to SVE pt and consent for c-section. Pt verbalize understanding. Pitocin d/c'd OR prep started.

## 2010-08-22 NOTE — Progress Notes (Signed)
Christy Harmon is a 30 y.o. G1P0 at [redacted]w[redacted]d admitted for induction of labor due to Gestational diabetes.  Subjective:   Objective: BP 120/82  Pulse 119  Temp(Src) 98.9 F (37.2 C) (Oral)  Resp 18  Ht 4\' 7"  (1.397 m)  Wt 152 lb (68.947 kg)  BMI 35.33 kg/m2  SpO2 92%      FHT:  FHR: 150 bpm, variability: moderate,  accelerations:  Present,  decelerations:  Absent UC:   irregular, every 2-5 minutes SVE:   Dilation: 4 Effacement (%): 80 Station: -2 Exam by:: Penley, RN   Assessment / Plan: IOL, still to become active  Labor: Progressing on Pitocin, will continue to increase then AROM. Will increase pitocin by 10mU/Min instead of 1.  Fetal Wellbeing:  Category I Pain Control:  Epidural I/D:  n/a  Honestii Marton N 08/22/2010, 3:08 AM

## 2010-08-22 NOTE — Progress Notes (Signed)
Christy Harmon is a 30 y.o. G1P0 at [redacted]w[redacted]d admitted for induction of labor due to Gestational diabetes.  Subjective:   Objective: BP 131/82  Pulse 115  Temp(Src) 98.9 F (37.2 C) (Axillary)  Resp 18  Ht 4\' 7"  (1.397 m)  Wt 152 lb (68.947 kg)  BMI 35.33 kg/m2  SpO2 92%     Dilation: 5.5 Effacement (%): 90 Cervical Position: Anterior Station: -1 Presentation: Vertex Exam by:: Penley, RN   FHT:  FHR: 145 bpm, variability: moderate,  accelerations:  Present,  decelerations:  Absent UC:   regular, every 3 minutes  Labs: Lab Results  Component Value Date   WBC 10.1 08/21/2010   HGB 13.7 08/21/2010   HCT 40.1 08/21/2010   MCV 92.0 08/21/2010   PLT 178 08/21/2010    Assessment / Plan: Induction of labor due to gestational diabetes,  progressing well on pitocin, on 81mU/min of pitocin. Will cont to increase as tolerated and indicated. AROM/clear, minimal return. Head was well applied prior to AROM.  Labor: Progressing normally Fetal Wellbeing:  Category I Pain Control:  Epidural I/D:  n/a   Avalina Benko N 08/22/2010, 5:27 AM

## 2010-08-22 NOTE — Plan of Care (Signed)
Problem: Phase II Progression Outcomes Goal: Initiate breastfeeding within 1hr delivery Outcome: Not Applicable Date Met:  08/22/10 Bottle feeding   Problem: Discharge Progression Outcomes Goal: Transfer to Post Partum room/infant to nursery Outcome: Not Applicable Date Met:  08/22/10 C/S for delivery - pt went to pacu

## 2010-08-22 NOTE — Consult Note (Signed)
[facility]  Delivery Note:  C-section       08/22/2010  8:59 PM  I was called to the operating room at the request of the patient's obstetrician (Dr. Emelda Fear) for failure to progress.   PRENATAL HX:  Gestational diabetes (glyburide), hyperthyroidism    INTRAPARTUM HX:   Low-grade fever; failure to progress   DELIVERY:   C/section uncomplicated;  Baby vigorous,  Apgars 8 and 9.  _______________________________________________________________________ Electronically Signed By: Angelita Ingles, MD Neonatologist

## 2010-08-22 NOTE — Progress Notes (Signed)
Christy Harmon is a 30 y.o. G1P0 at [redacted]w[redacted]d by ultrasound admitted for induction of labor due to Gestational diabetes.  Subjective:   Objective: BP 139/90  Pulse 120  Temp(Src) 98.6 F (37 C) (Oral)  Resp 20  Ht 4\' 7"  (1.397 m)  Wt 68.947 kg (152 lb)  BMI 35.33 kg/m2  SpO2 92%      FHT:  FHR: 135 bpm, variability: moderate,  accelerations:  Present,  decelerations:  Absent UC:   regular, every 2 minutes SVE:   Dilation: 7.5 Effacement (%): 100 Station: 0 Exam by:: SEF  Labs: Lab Results  Component Value Date   WBC 10.1 08/21/2010   HGB 13.7 08/21/2010   HCT 40.1 08/21/2010   MCV 92.0 08/21/2010   PLT 178 08/21/2010    Assessment / Plan: Spontaneous labor, progressing normally  Labor: Progressing normally Preeclampsia:  no signs or symptoms of toxicity Fetal Wellbeing:  Category I Pain Control:  Epidural I/D:  n/a Anticipated MOD:  NSVD  Zerita Boers 08/22/2010, 10:35 AM

## 2010-08-22 NOTE — Progress Notes (Signed)
Pt OR prep complete with consent signed.  Pt and spouse verbalized understanding of consent and procedure. Awaiting OR availability.

## 2010-08-22 NOTE — Anesthesia Postprocedure Evaluation (Signed)
  Anesthesia Post-op Note  Patient: Christy Harmon  Procedure(s) Performed:  CESAREAN SECTION - primary   Patient's cardiopulmonary status is stable Patient's level of consciousness: sedate but responsive verbally Pain and nausea are all reasonably controlled No anesthetic complications apparent at this time No follow up care necessary at this time

## 2010-08-22 NOTE — Progress Notes (Signed)
Patient reexamined at 18:00, found to have made no change.  Fetal heart rate still Cat I . Cesarean recommended.  Procedure reviewed, risks reviewed including infection, bleeding , injury to organs, need for transfusion, need for additional surgery such as injury repair or hysterectomy. Consent obtained. OR notified.

## 2010-08-23 ENCOUNTER — Encounter (HOSPITAL_COMMUNITY): Payer: Self-pay

## 2010-08-23 LAB — CBC
HCT: 33.7 % — ABNORMAL LOW (ref 36.0–46.0)
MCV: 92.1 fL (ref 78.0–100.0)
Platelets: 131 10*3/uL — ABNORMAL LOW (ref 150–400)
RBC: 3.66 MIL/uL — ABNORMAL LOW (ref 3.87–5.11)
WBC: 24.8 10*3/uL — ABNORMAL HIGH (ref 4.0–10.5)

## 2010-08-23 LAB — GLUCOSE, CAPILLARY: Glucose-Capillary: 127 mg/dL — ABNORMAL HIGH (ref 70–99)

## 2010-08-23 MED ORDER — PROPYLTHIOURACIL 50 MG PO TABS
50.0000 mg | ORAL_TABLET | Freq: Every day | ORAL | Status: DC
  Administered 2010-08-23 – 2010-08-25 (×3): 50 mg via ORAL
  Filled 2010-08-23 (×4): qty 1

## 2010-08-23 MED ORDER — ZOLPIDEM TARTRATE 5 MG PO TABS: 5.0000 mg | ORAL_TABLET | Freq: Every evening | ORAL | Status: DC | PRN

## 2010-08-23 MED ORDER — SODIUM CHLORIDE 0.9 % IV SOLN: 250.0000 mL | INTRAVENOUS | Status: DC

## 2010-08-23 MED ORDER — ONDANSETRON HCL 4 MG/2ML IJ SOLN: 4.0000 mg | INTRAMUSCULAR | Status: DC | PRN

## 2010-08-23 MED ORDER — NALOXONE HCL 0.4 MG/ML IJ SOLN: 0.4000 mg | INTRAMUSCULAR | Status: DC | PRN

## 2010-08-23 MED ORDER — MAGNESIUM HYDROXIDE 400 MG/5ML PO SUSP: 30.0000 mL | ORAL | Status: DC | PRN

## 2010-08-23 MED ORDER — NALBUPHINE HCL 10 MG/ML IJ SOLN
5.0000 mg | INTRAMUSCULAR | Status: AC | PRN
  Filled 2010-08-23: qty 1

## 2010-08-23 MED ORDER — OXYTOCIN 20 UNITS IN LACTATED RINGERS INFUSION - SIMPLE
125.0000 mL/h | INTRAVENOUS | Status: AC
  Filled 2010-08-23: qty 1000

## 2010-08-23 MED ORDER — WITCH HAZEL-GLYCERIN EX PADS: MEDICATED_PAD | CUTANEOUS | Status: DC | PRN

## 2010-08-23 MED ORDER — OXYCODONE-ACETAMINOPHEN 5-325 MG PO TABS
1.0000 | ORAL_TABLET | ORAL | Status: DC | PRN
  Administered 2010-08-23 – 2010-08-25 (×3): 1 via ORAL
  Filled 2010-08-23 (×3): qty 1

## 2010-08-23 MED ORDER — METOCLOPRAMIDE HCL 5 MG/ML IJ SOLN: 10.0000 mg | Freq: Three times a day (TID) | INTRAMUSCULAR | Status: DC | PRN

## 2010-08-23 MED ORDER — SODIUM CHLORIDE 0.9 % IV SOLN
1.0000 ug/kg/h | INTRAVENOUS | Status: DC | PRN
  Filled 2010-08-23: qty 2.5

## 2010-08-23 MED ORDER — NALBUPHINE SYRINGE 5 MG/0.5 ML
5.0000 mg | INJECTION | INTRAMUSCULAR | Status: AC | PRN
  Filled 2010-08-23: qty 1

## 2010-08-23 MED ORDER — CEFAZOLIN SODIUM 1-5 GM-% IV SOLN: 1.0000 g | Freq: Once | INTRAVENOUS | Status: DC

## 2010-08-23 MED ORDER — TETANUS-DIPHTH-ACELL PERTUSSIS 5-2.5-18.5 LF-MCG/0.5 IM SUSP
0.5000 mL | Freq: Once | INTRAMUSCULAR | Status: AC
  Administered 2010-08-23: 0.5 mL via INTRAMUSCULAR
  Filled 2010-08-23: qty 0.5

## 2010-08-23 MED ORDER — ONDANSETRON HCL 4 MG PO TABS: 4.0000 mg | ORAL_TABLET | ORAL | Status: DC | PRN

## 2010-08-23 MED ORDER — LACTATED RINGERS IV SOLN
INTRAVENOUS | Status: DC
  Administered 2010-08-23 (×2): via INTRAVENOUS

## 2010-08-23 MED ORDER — SENNOSIDES-DOCUSATE SODIUM 8.6-50 MG PO TABS
1.0000 | ORAL_TABLET | Freq: Every day | ORAL | Status: DC
  Administered 2010-08-23: 2 via ORAL
  Administered 2010-08-24: 1 via ORAL

## 2010-08-23 MED ORDER — DIPHENHYDRAMINE HCL 50 MG/ML IJ SOLN: 25.0000 mg | INTRAMUSCULAR | Status: DC | PRN

## 2010-08-23 MED ORDER — PRENATAL PLUS 27-1 MG PO TABS
1.0000 | ORAL_TABLET | Freq: Every day | ORAL | Status: DC
  Administered 2010-08-23 – 2010-08-25 (×3): 1 via ORAL
  Filled 2010-08-23 (×3): qty 1

## 2010-08-23 MED ORDER — SIMETHICONE 80 MG PO CHEW
80.0000 mg | CHEWABLE_TABLET | Freq: Three times a day (TID) | ORAL | Status: DC
  Administered 2010-08-23 – 2010-08-25 (×5): 80 mg via ORAL

## 2010-08-23 MED ORDER — IBUPROFEN 600 MG PO TABS
600.0000 mg | ORAL_TABLET | Freq: Four times a day (QID) | ORAL | Status: DC
  Administered 2010-08-23 – 2010-08-25 (×8): 600 mg via ORAL
  Filled 2010-08-23 (×8): qty 1

## 2010-08-23 MED ORDER — ONDANSETRON HCL 4 MG/2ML IJ SOLN: 4.0000 mg | Freq: Three times a day (TID) | INTRAMUSCULAR | Status: DC | PRN

## 2010-08-23 MED ORDER — SODIUM CHLORIDE 0.9 % IJ SOLN: 3.0000 mL | INTRAMUSCULAR | Status: DC | PRN

## 2010-08-23 MED ORDER — SODIUM CHLORIDE 0.9 % IJ SOLN: 3.0000 mL | Freq: Two times a day (BID) | INTRAMUSCULAR | Status: DC

## 2010-08-23 MED ORDER — SIMETHICONE 80 MG PO CHEW: 80.0000 mg | CHEWABLE_TABLET | ORAL | Status: DC | PRN

## 2010-08-23 MED ORDER — DIPHENHYDRAMINE HCL 25 MG PO CAPS: 25.0000 mg | ORAL_CAPSULE | ORAL | Status: DC | PRN

## 2010-08-23 MED ORDER — IBUPROFEN 600 MG PO TABS
600.0000 mg | ORAL_TABLET | Freq: Four times a day (QID) | ORAL | Status: DC | PRN
  Administered 2010-08-24 (×2): 600 mg via ORAL
  Filled 2010-08-23 (×2): qty 1

## 2010-08-23 MED ORDER — DIPHENHYDRAMINE HCL 25 MG PO CAPS: 25.0000 mg | ORAL_CAPSULE | Freq: Four times a day (QID) | ORAL | Status: DC | PRN

## 2010-08-23 MED ORDER — DIPHENHYDRAMINE HCL 50 MG/ML IJ SOLN: 12.5000 mg | INTRAMUSCULAR | Status: DC | PRN

## 2010-08-23 MED ORDER — MENTHOL 3 MG MT LOZG: 1.0000 | LOZENGE | OROMUCOSAL | Status: DC | PRN

## 2010-08-23 NOTE — Plan of Care (Signed)
Problem: Phase I Progression Outcomes Goal: OOB as tolerated unless otherwise ordered Outcome: Progressing Walked to [employer] and tolerated well Goal: IS, TCDB as ordered Outcome: Progressing Has to be instructed each time on how to use her I/S which she has only gotten up to 1500 a couple of times Goal: VS, stable, temp < 100.4 degrees F Outcome: Progressing Temp 100.3 on arrival to floor,but has been afebrile since Goal: Initial discharge plan identified Outcome: Progressing Good pain control,bleeding WNL,ambulating well.VSS,Incision WNL  Problem: Discharge Progression Outcomes Goal: Barriers To Progression Addressed/Resolved Outcome: Progressing Infant in NICU

## 2010-08-23 NOTE — Progress Notes (Signed)
Subjective: Postpartum Day 1: Cesarean Delivery Patient reports incisional pain and tolerating PO.    Objective: Vital signs in last 24 hours: Temp:  [98.1 F (36.7 C)-101.1 F (38.4 C)] 98.1 F (36.7 C) (07/23 0600) Pulse Rate:  [104-146] 104  (07/23 0600) Resp:  [17-33] 20  (07/23 0600) BP: (123-163)/(60-109) 130/89 mmHg (07/23 0600) SpO2:  [90 %-98 %] 98 % (07/23 0600)  Physical Exam:  General: alert, cooperative, appears stated age and no distress Lochia: appropriate Uterine Fundus: firm Incision: dressing dry and intact. DVT Evaluation: No evidence of DVT seen on physical exam. Negative Homan's sign. No cords or calf tenderness. No significant calf/ankle edema.   Basename 08/23/10 0524 08/21/10 2028  HGB 11.3* 13.7  HCT 33.7* 40.1    Assessment/Plan: Status post Cesarean section. Doing well postoperatively.  Continue current care.  Zerita Boers 08/23/2010, 7:19 AM

## 2010-08-23 NOTE — Progress Notes (Signed)
UR chart review completed.  

## 2010-08-23 NOTE — Op Note (Signed)
NAME:  Christy Harmon, Christy Harmon NO.:  [id number]  MEDICAL RECORD NO.:  [id number]  LOCATION:  9309                          FACILITY:  [facility]  PHYSICIAN:  Tilda Burrow, M.D. DATE OF BIRTH:  October 01, 1980  DATE OF PROCEDURE: DATE OF DISCHARGE:                              OPERATIVE REPORT   PREOPERATIVE DIAGNOSES:  Pregnancy at 39 weeks 1 day medical induction of labor for gestational diabetes, failed induction of labor secondary to cephalopelvic disproportion.  POSTOPERATIVE DIAGNOSES:  Pregnancy at 39 weeks 1 day medical induction of labor for gestational diabetes, failed induction of labor secondary to cephalopelvic disproportion.  PROCEDURE:  Primary low-transverse cervical cesarean section.  SURGEON:  Tilda Burrow, MD  ASSISTANT:  None.  ANESTHESIA:  Epidural.  COMPLICATIONS:  None.  FINDINGS:  A 9 pound 9-1/2 ounce female infant, Apgar's 9 and 9.  Markedly moulded vertex, narrow pelvis.  INDICATIONS:  Arrest of labor at 8 cm dilated x6 hours.  DETAILS OF PROCEDURE:  The patient was taken to the operating room with epidural analgesia topped up.  Time-out conducted and procedure initiated.  The patient received antibiotics immediately before, ampicillin and gentamicin as she had begun a low-grade temperature beginning at 7:00 p.m. waiting for C-section.  C-section was delayed due to emergency case elsewhere approximately 1 hour.  The patient had a Pfannenstiel type incision performed with easy entry of the abdominal cavity.  Bladder flap developed on lower uterine segment and transverse uterine incision made in the uterus with fetal vertex rotated from the occiput posterior position into the incision delivered easily through the transverse incision.  Cord was clamped. Cord blood samples obtained and placenta extracted using uterine massage.  Uterus was boggy and did not respond well to massage, so was exteriorized and then irrigated out and massaged while  IV oxytocin infused.  Transverse incision was closed using 2-layer running locking, first layer was 0 chromic followed by a continuous running second layer with good results.  Bladder flap was loosely reapproximated with 2-0 Vicryl.  Anteriorly the uterus was returned to the abdomen.  The abdomen was irrigated and anterior peritoneum was closed with 2-0 Vicryl.  The fascia was closed with running 0 Vicryl.  Subcu tissues were reapproximated with interrupted 2-0 Vicryl and then subcuticular 4-0 Vicryl closure of the skin completed the procedure.  Sponge and needle counts were correct.  EBL 800 mL.     Tilda Burrow, M.D.     JVF/MEDQ  D:  08/22/2010  T:  08/23/2010  Job:  [id number]  cc:   [facility]

## 2010-08-24 LAB — GLUCOSE, CAPILLARY
Glucose-Capillary: 100 mg/dL — ABNORMAL HIGH (ref 70–99)
Glucose-Capillary: 132 mg/dL — ABNORMAL HIGH (ref 70–99)

## 2010-08-25 ENCOUNTER — Encounter (HOSPITAL_COMMUNITY)
Admission: RE | Admit: 2010-08-25 | Discharge: 2010-08-25 | Disposition: A | Discharge status: Home / Self Care | Payer: Managed Care, Other (non HMO) | Source: Ambulatory Visit | Attending: Obstetrics & Gynecology | Admitting: Obstetrics & Gynecology

## 2010-08-25 DIAGNOSIS — O923 Agalactia: Secondary | ICD-10-CM | POA: Insufficient documentation

## 2010-08-25 LAB — COMPREHENSIVE METABOLIC PANEL
ALT: 20 U/L (ref 0–35)
AST: 38 U/L — ABNORMAL HIGH (ref 0–37)
Albumin: 1.7 g/dL — ABNORMAL LOW (ref 3.5–5.2)
Calcium: 7.3 mg/dL — ABNORMAL LOW (ref 8.4–10.5)
GFR calc Af Amer: >60 mL/min (ref >60)
Sodium: 140 mEq/L (ref 135–145)
Total Protein: 5.4 g/dL — ABNORMAL LOW (ref 6.0–8.3)

## 2010-08-25 LAB — CBC
MCH: 30.7 pg (ref 26.0–34.0)
MCHC: 33.6 g/dL (ref 30.0–36.0)
Platelets: 168 10*3/uL (ref 150–400)
RDW: 14.8 % (ref 11.5–15.5)

## 2010-08-25 LAB — URINE MICROSCOPIC-ADD ON

## 2010-08-25 LAB — PROTEIN / CREATININE RATIO, URINE
Protein Creatinine Ratio: 0.19 — ABNORMAL HIGH (ref 0.00–0.15)
Total Protein, Urine: 7.8 mg/dL

## 2010-08-25 LAB — URINALYSIS, ROUTINE W REFLEX MICROSCOPIC
Bilirubin Urine: NEGATIVE
Glucose, UA: NEGATIVE mg/dL
Nitrite: NEGATIVE
Specific Gravity, Urine: <1.005 — ABNORMAL LOW (ref 1.005–1.030)
pH: 7 (ref 5.0–8.0)

## 2010-08-25 LAB — GLUCOSE, CAPILLARY
Glucose-Capillary: 63 mg/dL — ABNORMAL LOW (ref 70–99)
Glucose-Capillary: 75 mg/dL (ref 70–99)

## 2010-08-25 MED ORDER — IBUPROFEN 800 MG PO TABS: 800.0000 mg | ORAL_TABLET | Freq: Three times a day (TID) | ORAL | Status: AC | PRN

## 2010-08-25 MED ORDER — OXYCODONE-ACETAMINOPHEN 7.5-500 MG PO TABS: 1.0000 | ORAL_TABLET | Freq: Four times a day (QID) | ORAL | Status: AC | PRN

## 2010-08-25 NOTE — Progress Notes (Addendum)
Subjective: Postpartum Day 3: Cesarean Delivery Patient reports incisional pain that is well controlled with Motrin, tolerating PO, + BM and no problems voiding. Reports that the scopolamine patch has helped control her nausea completely. Lochia reported as same as a period. She is ambulating well, taking trips to the NICU to see her baby boy twice daily. Denies any dizziness, HA, loss of balance, or vision change. Denies RUQ, epigastric, or chest pain. No SOB, no problems with motor function.   Patient has been using breast pump every 3 hours, but has not yet obtained milk. She is undecided as to plan for contraception, but reports that she and her husband are going to discuss it and talk to Dr. Emelda Fear at her follow up visit.   Ms. Mccombs reports that she was diagnosed with Diabetes in 2009 and was on Januvia. Her MD stopped this medication in early pregnancy and blood sugars were initially well controlled, but them became elevated and she required glyburide for the remainder of pregnancy.   Objective: Vital signs in last 24 hours: Temp:  [97.7 F (36.5 C)-98.9 F (37.2 C)] 97.7 F (36.5 C) (07/25 0600) Pulse Rate:  [84-92] 87  (07/25 0600) Resp:  [18] 18  (07/25 0600) BP: (131-150)/(86-99) 146/94 mmHg (07/25 0600) SpO2:  [95 %-99 %] 99 % (07/25 0600)  Physical Exam:  General: alert, cooperative, appears stated age and no distress Heart: RRR, S1S2 distinct without murmur, clicks, rubs, gallops Lungs: CTAB Abdomen: no masses, mild tenderness to palpation, small umbilical hernia noted Lochia: appropriate Uterine Fundus: firm Incision: healing well, no significant drainage, no dehiscence, no significant erythema PV: No evidence of DVT seen on physical exam, Negative Homan's sign, No cords or calf tenderness, DP pulses 2+ bilaterally Neuro: A&O x3, 1 beat clonus bilaterally, 3+ patellar DTRs   Basename 08/23/10 0524  HGB 11.3*  HCT 33.7*    Assessment/Plan: Status post Cesarean  section. Doing well postoperatively.  Elevated BPs--PIH labs ordered  Discharge home with standard precautions and return to clinic in 4-6 weeks pending results of PIH labs Encouraged patient to continue pumping breasts as it may take a few days before there is production of milk.  Ibuprofen/Percocet for pain control Multivitamin   AHLFIELD, EVELYN, PA-S 08/25/2010, 10:05 AM

## 2010-08-25 NOTE — Consult Note (Signed)
[facility]ented [facility].  Reviewed pumping for NICU baby.

## 2010-08-25 NOTE — Progress Notes (Signed)
Pt is discharged in the care of friend. Stable. Denies any pain or discomfort. Incision clean and dry.

## 2010-10-09 NOTE — Discharge Summary (Signed)
Obstetric Discharge Summary Reason for Admission: cesarean section Prenatal Procedures: none Intrapartum Procedures: cesarean: low cervical, transverse Postpartum Procedures: none Complications-Operative and Postpartum: none Hemoglobin  Date Value Range Status  08/25/2010 11.1* 12.0-15.0 (g/dL) Final     HCT  Date Value Range Status  08/25/2010 33.0* 36.0-46.0 (%) Final    Discharge Diagnoses: Term Pregnancy-delivered  Discharge Information: Date: 10/09/2010 Activity: pelvic rest Diet: routine Medications: Ibuprophen, Colace and Percocet Condition: stable Instructions: refer to practice specific booklet Discharge to: home   Newborn Data: Live born female  Birth Weight: 9 lb 9.8 oz (4360 g) APGAR: 8, 9  Home with mother.  Lindey Renzulli 10/09/2010, 12:21 PM

## 2010-10-15 ENCOUNTER — Encounter (HOSPITAL_COMMUNITY): Payer: Self-pay | Admitting: *Deleted

## 2010-11-02 ENCOUNTER — Encounter (HOSPITAL_COMMUNITY): Payer: Self-pay | Admitting: Obstetrics and Gynecology

## 2010-12-03 ENCOUNTER — Other Ambulatory Visit: Payer: Self-pay | Admitting: Obstetrics & Gynecology

## 2010-12-03 ENCOUNTER — Other Ambulatory Visit (HOSPITAL_COMMUNITY)
Admission: RE | Admit: 2010-12-03 | Discharge: 2010-12-03 | Disposition: A | Discharge status: Home / Self Care | Payer: Self-pay | Source: Ambulatory Visit | Attending: Obstetrics & Gynecology | Admitting: Obstetrics & Gynecology

## 2010-12-03 DIAGNOSIS — Z01419 Encounter for gynecological examination (general) (routine) without abnormal findings: Secondary | ICD-10-CM | POA: Insufficient documentation

## 2011-07-05 IMAGING — US US FETAL BPP W/O NONSTRESS
1 series · 13 of 28 positions shown · non-contrast
Comparison: none

[Series 1: us fetal bpp w/o nonstress · non-contrast · 38 acquisitions, 13 frames shown]
[im 2/38]
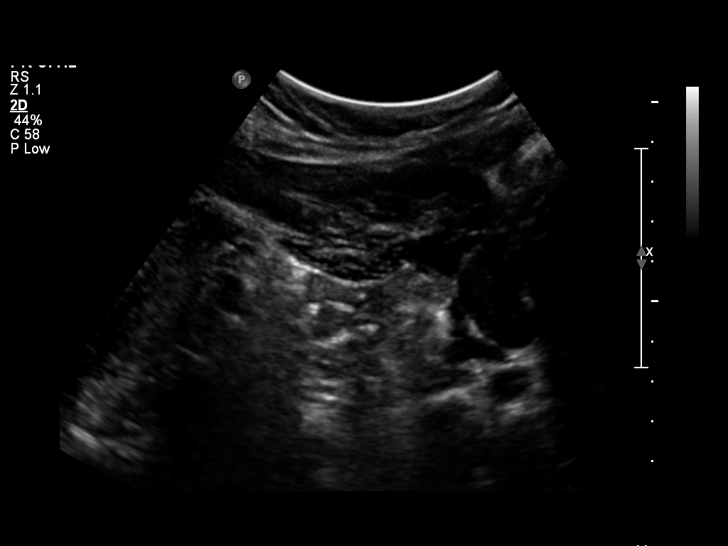
[im 5/38]
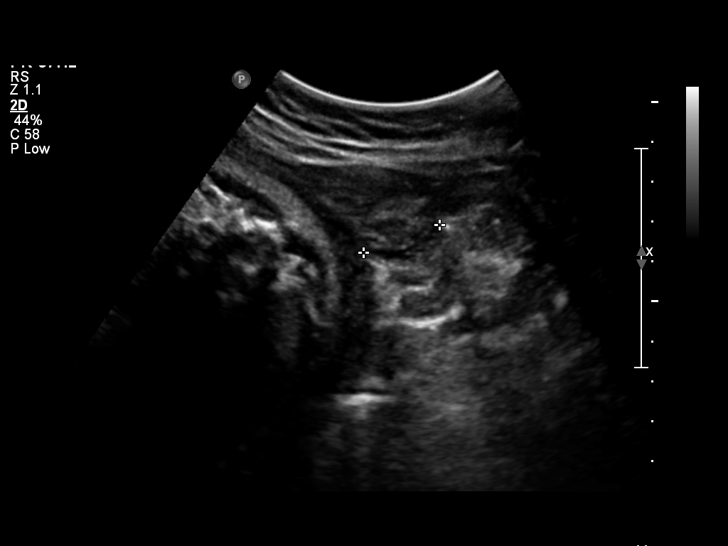
[im 7/38]
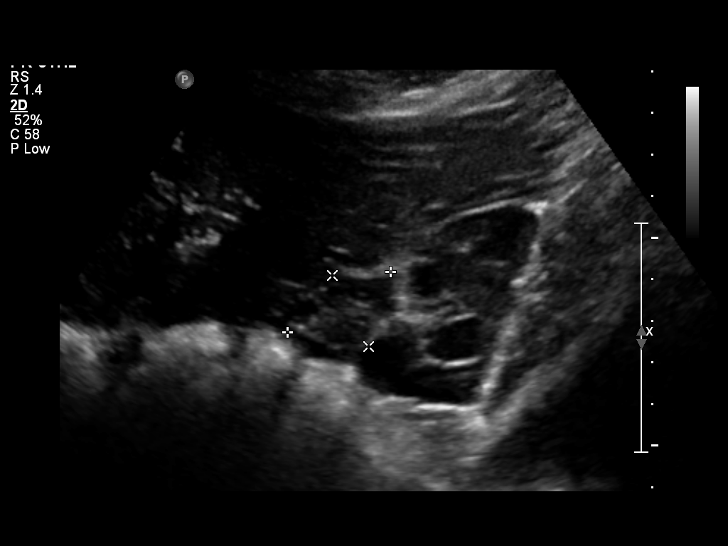
[im 10/38]
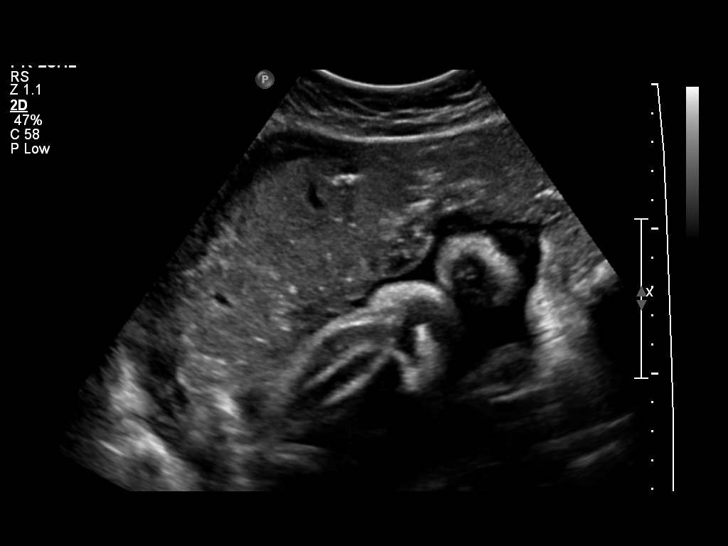
[im 13/38]
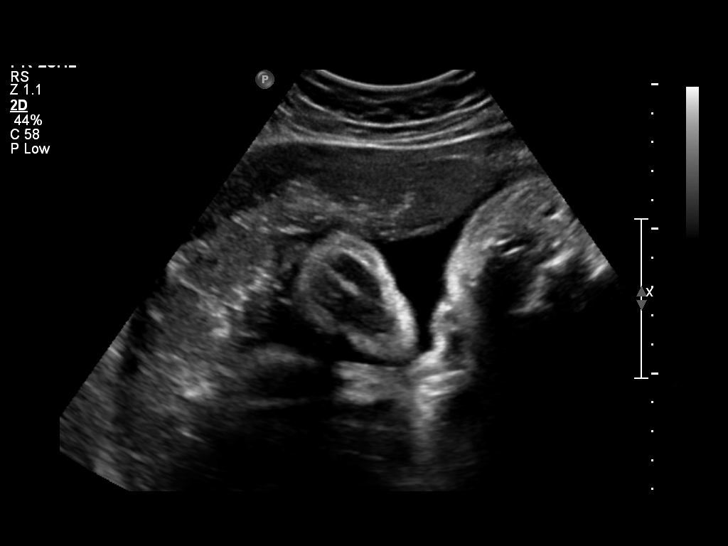
[im 16/38]
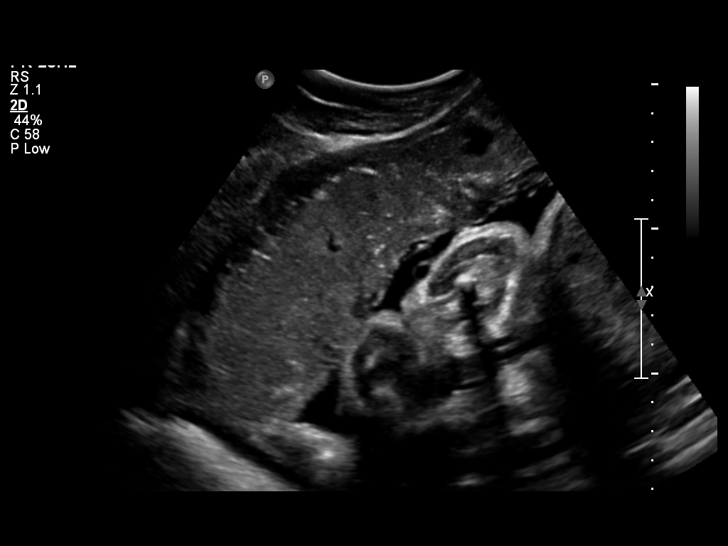
[im 20/38]
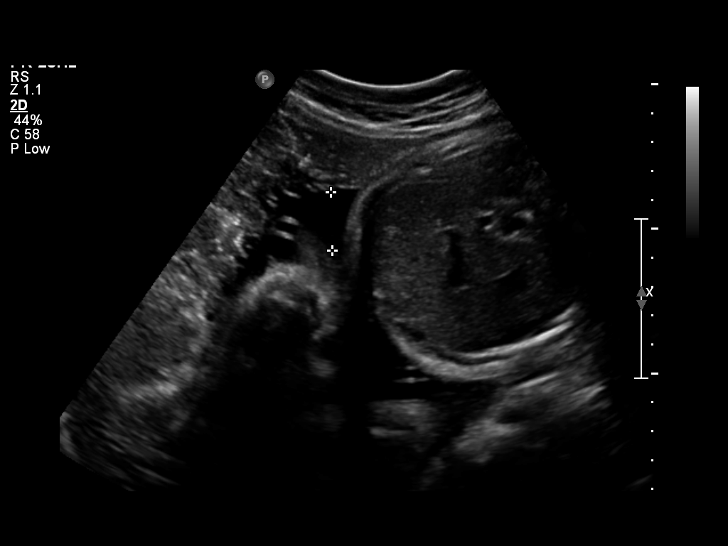
[im 22/38]
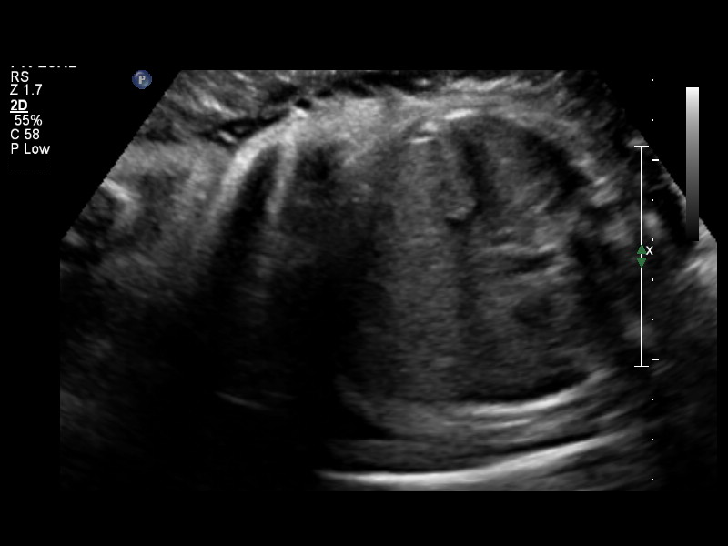
[im 25/38]
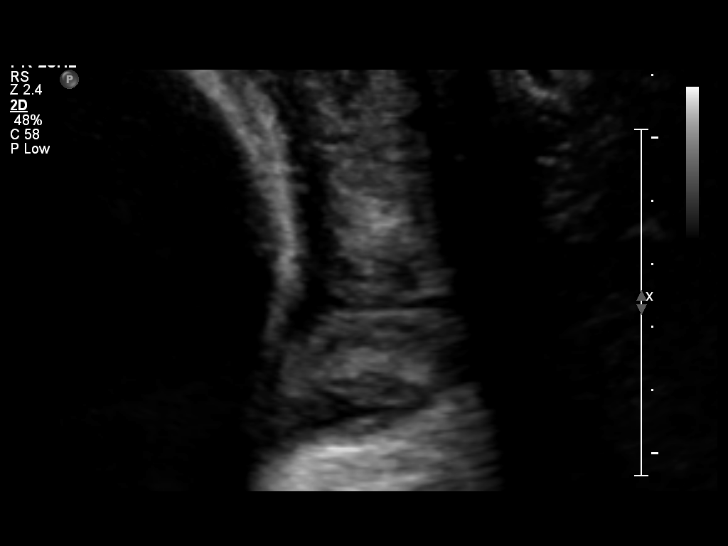
[im 28/38]
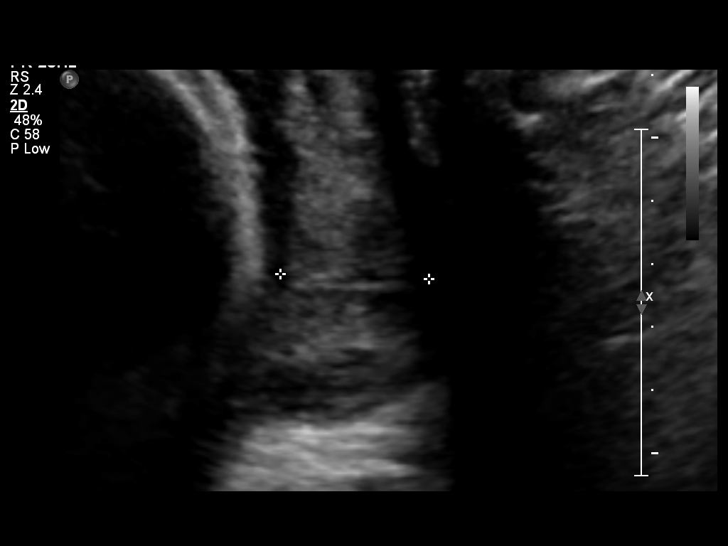
[im 31/38]
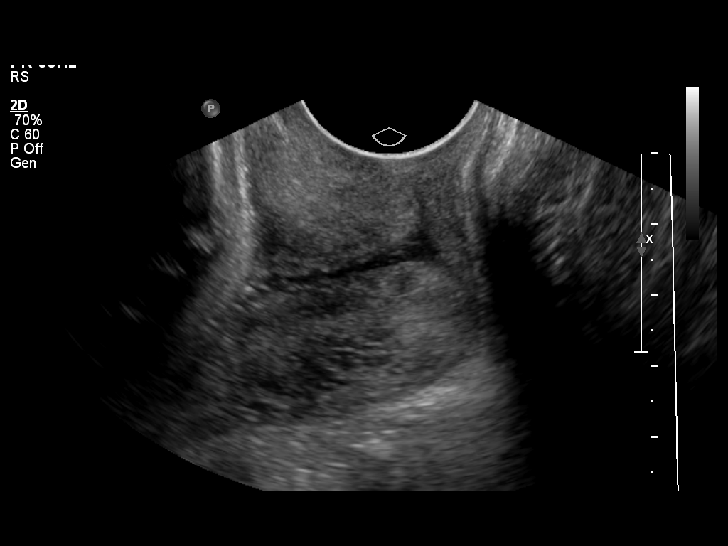
[im 33/38]
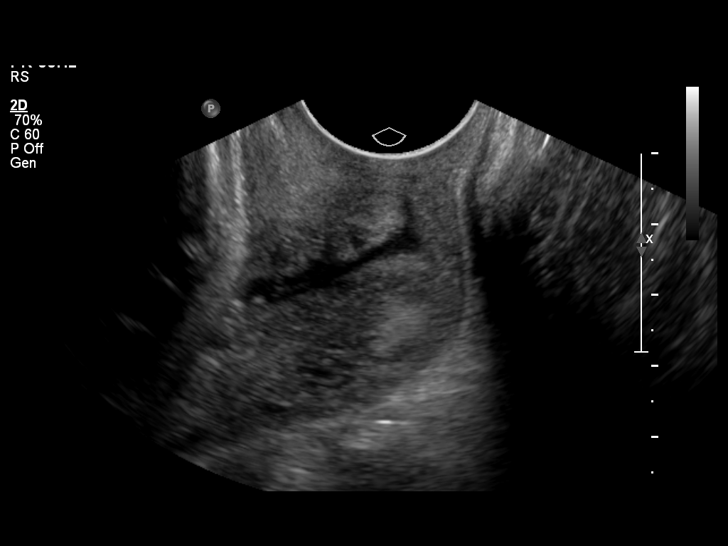
[im 36/38]
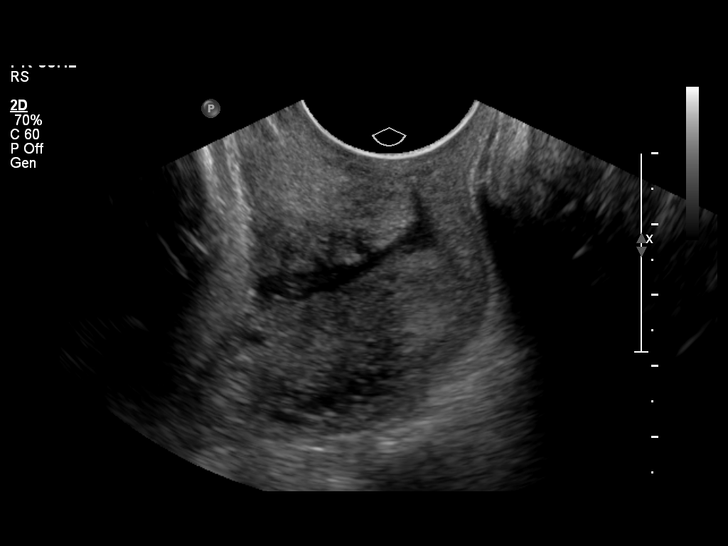

[13 of 28 positions shown; findings below may reference images not displayed]

OBSTETRICS REPORT
                      (Signed Final 07/01/2010 [DATE])

                 54_[id number]
Procedures

 [HOSPITAL]                                         76815.0
 US OB TRANSVAGINAL                                    76817.0
Indications

 Non-reactive NST, FHR variables
 Diabetes - Pregestational, Class B
 [DATE] BPP in office
Fetal Evaluation

 Fetal Heart Rate:  152                          bpm
 Cardiac Activity:  Observed
 Presentation:      Cephalic
 Placenta:          Anterior, above cervical os

 Comment:    [DATE] BPP in 10 minutes

 Amniotic Fluid
 AFI FV:      Subjectively low-normal
 AFI Sum:     11.4    cm       27  %Tile     Larg Pckt:    4.56  cm
 RUQ:   4.56    cm   RLQ:    2.01   cm    LUQ:   2.5     cm   LLQ:    2.33   cm
Biophysical Evaluation

 Amniotic F.V:   Pocket => 2 cm two         F. Tone:        Observed
                 planes
 F. Movement:    Observed                   Score:          [DATE]
 F. Breathing:   Observed
Gestational Age

 LMP:           31w 6d        Date:  11/20/09                 EDD:   08/27/10
 Best:          31w 6d     Det. By:  LMP  (11/20/09)          EDD:   08/27/10
Cervix Uterus Adnexa

 Cervical Length:    2.4      cm

 Cervix:       Measured transvaginally.
 Left Ovary:    Within normal limits.
 Right Ovary:   Within normal limits.
Impression

   [DATE] BPP.

  Short cervix, measuring 2.4 cm.

  The amniotic fluid volume is at the lower limits of normal.

## 2011-12-19 ENCOUNTER — Other Ambulatory Visit: Payer: Self-pay | Admitting: Obstetrics & Gynecology

## 2011-12-19 ENCOUNTER — Other Ambulatory Visit (HOSPITAL_COMMUNITY)
Admission: RE | Admit: 2011-12-19 | Discharge: 2011-12-19 | Disposition: A | Discharge status: Home / Self Care | Payer: Managed Care, Other (non HMO) | Source: Ambulatory Visit | Attending: Obstetrics & Gynecology | Admitting: Obstetrics & Gynecology

## 2011-12-19 DIAGNOSIS — Z1151 Encounter for screening for human papillomavirus (HPV): Secondary | ICD-10-CM | POA: Insufficient documentation

## 2011-12-19 DIAGNOSIS — Z01419 Encounter for gynecological examination (general) (routine) without abnormal findings: Secondary | ICD-10-CM | POA: Insufficient documentation

## 2012-10-16 ENCOUNTER — Other Ambulatory Visit (HOSPITAL_COMMUNITY): Payer: Self-pay | Admitting: Endocrinology

## 2012-10-16 DIAGNOSIS — E0500 Thyrotoxicosis with diffuse goiter without thyrotoxic crisis or storm: Secondary | ICD-10-CM

## 2012-10-22 ENCOUNTER — Ambulatory Visit (HOSPITAL_COMMUNITY): Admission: RE | Admit: 2012-10-22 | Payer: Managed Care, Other (non HMO) | Source: Ambulatory Visit

## 2012-10-23 ENCOUNTER — Other Ambulatory Visit (HOSPITAL_COMMUNITY): Payer: Managed Care, Other (non HMO)

## 2012-10-24 ENCOUNTER — Encounter (HOSPITAL_COMMUNITY)
Admission: RE | Admit: 2012-10-24 | Discharge: 2012-10-24 | Disposition: A | Discharge status: Home / Self Care | Payer: 59 | Source: Ambulatory Visit | Attending: Endocrinology | Admitting: Endocrinology

## 2012-10-24 ENCOUNTER — Encounter (HOSPITAL_COMMUNITY): Payer: Self-pay

## 2012-10-24 DIAGNOSIS — E0500 Thyrotoxicosis with diffuse goiter without thyrotoxic crisis or storm: Secondary | ICD-10-CM | POA: Insufficient documentation

## 2012-10-24 MED ORDER — SODIUM IODIDE I 131 CAPSULE
13.0000 | Freq: Once | INTRAVENOUS | Status: AC | PRN
  Administered 2012-10-24: 13 via ORAL

## 2012-10-25 ENCOUNTER — Encounter (HOSPITAL_COMMUNITY): Payer: Self-pay

## 2012-10-25 ENCOUNTER — Encounter (HOSPITAL_COMMUNITY)
Admission: RE | Admit: 2012-10-25 | Discharge: 2012-10-25 | Disposition: A | Discharge status: Home / Self Care | Payer: 59 | Source: Ambulatory Visit | Attending: Endocrinology | Admitting: Endocrinology

## 2012-10-25 MED ORDER — SODIUM PERTECHNETATE TC 99M INJECTION
10.0000 | Freq: Once | INTRAVENOUS | Status: AC | PRN
  Administered 2012-10-25: 10 via INTRAVENOUS

## 2012-10-31 ENCOUNTER — Other Ambulatory Visit (HOSPITAL_COMMUNITY): Payer: Self-pay | Admitting: Endocrinology

## 2012-10-31 DIAGNOSIS — E0500 Thyrotoxicosis with diffuse goiter without thyrotoxic crisis or storm: Secondary | ICD-10-CM

## 2012-11-02 ENCOUNTER — Encounter (HOSPITAL_COMMUNITY): Payer: 59

## 2012-12-17 ENCOUNTER — Other Ambulatory Visit: Payer: Self-pay | Admitting: Obstetrics & Gynecology

## 2012-12-18 ENCOUNTER — Other Ambulatory Visit: Payer: Self-pay | Admitting: Obstetrics & Gynecology

## 2013-01-21 ENCOUNTER — Other Ambulatory Visit (HOSPITAL_COMMUNITY)
Admission: RE | Admit: 2013-01-21 | Discharge: 2013-01-21 | Disposition: A | Discharge status: Home / Self Care | Payer: 59 | Source: Ambulatory Visit | Attending: Obstetrics & Gynecology | Admitting: Obstetrics & Gynecology

## 2013-01-21 ENCOUNTER — Encounter (INDEPENDENT_AMBULATORY_CARE_PROVIDER_SITE_OTHER): Payer: Self-pay

## 2013-01-21 ENCOUNTER — Encounter: Payer: Self-pay | Admitting: Obstetrics & Gynecology

## 2013-01-21 ENCOUNTER — Ambulatory Visit (INDEPENDENT_AMBULATORY_CARE_PROVIDER_SITE_OTHER): Payer: 59 | Admitting: Obstetrics & Gynecology

## 2013-01-21 VITALS — BP 128/70 | Ht <= 58 in | Wt 134.5 lb

## 2013-01-21 DIAGNOSIS — Z01419 Encounter for gynecological examination (general) (routine) without abnormal findings: Secondary | ICD-10-CM | POA: Insufficient documentation

## 2013-01-21 DIAGNOSIS — Z1151 Encounter for screening for human papillomavirus (HPV): Secondary | ICD-10-CM | POA: Insufficient documentation

## 2013-01-21 DIAGNOSIS — E119 Type 2 diabetes mellitus without complications: Secondary | ICD-10-CM | POA: Insufficient documentation

## 2013-01-21 MED ORDER — NORGESTIM-ETH ESTRAD TRIPHASIC 0.18/0.215/0.25 MG-35 MCG PO TABS: ORAL_TABLET | ORAL | Status: DC

## 2013-01-21 NOTE — Progress Notes (Signed)
Patient ID: [name], female   DOB: [birth year], 32 y.o.   MRN: [id number] Subjective:     Christy Harmon is a 32 y.o. female here for a routine exam.  Patient's last menstrual period was 12/22/2012. G1P1001 Birth Control Method:  ocp Menstrual Calendar(currently): regular  Current complaints: none.   Current acute medical issues:  none   Recent Gynecologic History Patient's last menstrual period was 12/22/2012. Last Pap: 2013,  normal   Past Medical History  Diagnosis Date  . Preterm labor   . Diabetes mellitus   . Hyperthyroidism     Past Surgical History  Procedure Laterality Date  . Cesarean section    . Cesarean section  08/22/2010    Procedure: CESAREAN SECTION;  Surgeon: Tilda Burrow, MD;  Location: [facility];  Service: Gynecology;  Laterality: N/A;  primary   . Cesarean section  08/22/2010    Procedure: CESAREAN SECTION;  Surgeon: Tilda Burrow, MD;  Location: [facility];  Service: Gynecology;  Laterality: N/A;    OB History   Grav Para Term Preterm Abortions TAB SAB Ect Mult Living   1 1 1   0  0   1      History   Social History  . Marital Status: Married    Spouse Name: N/A    Number of Children: N/A  . Years of Education: N/A   Social History Main Topics  . Smoking status: Never Smoker   . Smokeless tobacco: Never Used  . Alcohol Use: No  . Drug Use: No  . Sexual Activity: Yes    Birth Control/ Protection: Pill   Other Topics Concern  . None   Social History Narrative  . None    Family History  Problem Relation Age of Onset  . Diabetes Mother   . Diabetes Brother      Review of Systems  Review of Systems  Constitutional: Negative for fever, chills, weight loss, malaise/fatigue and diaphoresis.  HENT: Negative for hearing loss, ear pain, nosebleeds, congestion, sore throat, neck pain, tinnitus and ear discharge.   Eyes: Negative for blurred vision, double vision, photophobia, pain, discharge and redness.  Respiratory: Negative for  cough, hemoptysis, sputum production, shortness of breath, wheezing and stridor.   Cardiovascular: Negative for chest pain, palpitations, orthopnea, claudication, leg swelling and PND.  Gastrointestinal: negative for abdominal pain. Negative for heartburn, nausea, vomiting, diarrhea, constipation, blood in stool and melena.  Genitourinary: Negative for dysuria, urgency, frequency, hematuria and flank pain.  Musculoskeletal: Negative for myalgias, back pain, joint pain and falls.  Skin: Negative for itching and rash.  Neurological: Negative for dizziness, tingling, tremors, sensory change, speech change, focal weakness, seizures, loss of consciousness, weakness and headaches.  Endo/Heme/Allergies: Negative for environmental allergies and polydipsia. Does not bruise/bleed easily.  Psychiatric/Behavioral: Negative for depression, suicidal ideas, hallucinations, memory loss and substance abuse. The patient is not nervous/anxious and does not have insomnia.        Objective:    Physical Exam  Vitals reviewed. Constitutional: She is oriented to person, place, and time. She appears well-developed and well-nourished.  HENT:  Head: Normocephalic and atraumatic.        Right Ear: External ear normal.  Left Ear: External ear normal.  Nose: Nose normal.  Mouth/Throat: Oropharynx is clear and moist.  Eyes: Conjunctivae and EOM are normal. Pupils are equal, round, and reactive to light. Right eye exhibits no discharge. Left eye exhibits no discharge. No scleral icterus.  Neck: Normal range of  motion. Neck supple. No tracheal deviation present. No thyromegaly present.  Cardiovascular: Normal rate, regular rhythm, normal heart sounds and intact distal pulses.  Exam reveals no gallop and no friction rub.   No murmur heard. Respiratory: Effort normal and breath sounds normal. No respiratory distress. She has no wheezes. She has no rales. She exhibits no tenderness.  GI: Soft. Bowel sounds are normal. She  exhibits no distension and no mass. There is no tenderness. There is no rebound and no guarding.  Genitourinary:  Breasts no masses skin changes or nipple changes bilaterally      Vulva is normal without lesions Vagina is pink moist without discharge Cervix normal in appearance and pap is done Uterus is normal size shape and contour Adnexa is negative with normal sized ovaries   Musculoskeletal: Normal range of motion. She exhibits no edema and no tenderness.  Neurological: She is alert and oriented to person, place, and time. She has normal reflexes. She displays normal reflexes. No cranial nerve deficit. She exhibits normal muscle tone. Coordination normal.  Skin: Skin is warm and dry. No rash noted. No erythema. No pallor.  Psychiatric: She has a normal mood and affect. Her behavior is normal. Judgment and thought content normal.       Assessment:    Healthy female exam.    Plan:    Contraception: OCP (estrogen/progesterone). Follow up in: 1 years.

## 2013-12-02 ENCOUNTER — Encounter: Payer: Self-pay | Admitting: Obstetrics & Gynecology

## 2014-01-21 ENCOUNTER — Other Ambulatory Visit: Payer: Self-pay | Admitting: Obstetrics & Gynecology

## 2014-01-23 ENCOUNTER — Other Ambulatory Visit: Payer: 59 | Admitting: Obstetrics & Gynecology

## 2014-01-28 ENCOUNTER — Other Ambulatory Visit: Payer: 59 | Admitting: Obstetrics & Gynecology

## 2014-02-04 ENCOUNTER — Other Ambulatory Visit: Payer: 59 | Admitting: Obstetrics & Gynecology

## 2014-02-11 ENCOUNTER — Ambulatory Visit (INDEPENDENT_AMBULATORY_CARE_PROVIDER_SITE_OTHER): Payer: BLUE CROSS/BLUE SHIELD | Admitting: Obstetrics & Gynecology

## 2014-02-11 ENCOUNTER — Encounter: Payer: Self-pay | Admitting: Obstetrics & Gynecology

## 2014-02-11 ENCOUNTER — Other Ambulatory Visit (HOSPITAL_COMMUNITY)
Admission: RE | Admit: 2014-02-11 | Discharge: 2014-02-11 | Disposition: A | Discharge status: Home / Self Care | Payer: BLUE CROSS/BLUE SHIELD | Source: Ambulatory Visit | Attending: Obstetrics & Gynecology | Admitting: Obstetrics & Gynecology

## 2014-02-11 VITALS — BP 100/80 | Ht <= 58 in | Wt 142.0 lb

## 2014-02-11 DIAGNOSIS — Z3202 Encounter for pregnancy test, result negative: Secondary | ICD-10-CM

## 2014-02-11 DIAGNOSIS — Z01419 Encounter for gynecological examination (general) (routine) without abnormal findings: Secondary | ICD-10-CM

## 2014-02-11 LAB — POCT URINE PREGNANCY: PREG TEST UR: NEGATIVE

## 2014-02-11 NOTE — Addendum Note (Signed)
Addended by: Criss AlvinePULLIAM, Terryl Molinelli G on: 02/11/2014 10:23 AM   Modules accepted: Orders

## 2014-02-11 NOTE — Progress Notes (Signed)
Patient ID: Christy HackAdrianne J Mountz, female   DOB: 08/24/1980, 34 y.o.   MRN: [id number] Subjective:     Christy Harmon is a 34 y.o. female here for a routine exam.  Patient's last menstrual period was 01/26/2014. G1P1001 Birth Control Method:  Ortho tri cyclen Menstrual Calendar(currently): amneorrhiec  Current complaints: recently diagnosed with hypothyroidism.   Current acute medical issues:  As above   Recent Gynecologic History Patient's last menstrual period was 01/26/2014. Last Pap: 2014,  normal Last mammogram: na,    Past Medical History  Diagnosis Date  . Preterm labor   . Diabetes mellitus   . Hyperthyroidism     Past Surgical History  Procedure Laterality Date  . Cesarean section    . Cesarean section  08/22/2010    Procedure: CESAREAN SECTION;  Surgeon: Tilda BurrowJohn V Ferguson, MD;  Location: [facility];  Service: Gynecology;  Laterality: N/A;  primary   . Cesarean section  08/22/2010    Procedure: CESAREAN SECTION;  Surgeon: Tilda BurrowJohn V Ferguson, MD;  Location: [facility];  Service: Gynecology;  Laterality: N/A;    OB History    Gravida Para Term Preterm AB TAB SAB Ectopic Multiple Living   1 1 1   0  0   1      History   Social History  . Marital Status: Married    Spouse Name: N/A    Number of Children: N/A  . Years of Education: N/A   Social History Main Topics  . Smoking status: Never Smoker   . Smokeless tobacco: Never Used  . Alcohol Use: No  . Drug Use: No  . Sexual Activity: Yes    Birth Control/ Protection: Pill   Other Topics Concern  . None   Social History Narrative    Family History  Problem Relation Age of Onset  . Diabetes Mother   . Diabetes Brother   . Hypertension Father     Current outpatient prescriptions: levothyroxine (SYNTHROID, LEVOTHROID) 88 MCG tablet, Take 44 mcg by mouth daily before breakfast., Disp: , Rfl: ;  TRI-LINYAH 0.18/0.215/0.25 MG-35 MCG tablet, take as directed, Disp: 28 tablet, Rfl: 12;  glyBURIDE (DIABETA) 2.5 MG tablet, Take  2.5 mg by mouth daily with breakfast.  , Disp: , Rfl: ;  Loratadine (CLARITIN PO), Take by mouth daily., Disp: , Rfl:  methimazole (TAPAZOLE) 10 MG tablet, Take 10 mg by mouth daily., Disp: , Rfl: ;  metoprolol succinate (TOPROL-XL) 50 MG 24 hr tablet, Take 25 mg by mouth 2 (two) times daily. Take with or immediately following a meal., Disp: , Rfl: ;  omeprazole (PRILOSEC) 20 MG capsule, Take 20 mg by mouth daily.  , Disp: , Rfl: ;  prenatal vitamin w/FE, FA (PRENATAL 1 + 1) 27-1 MG TABS, Take 1 tablet by mouth daily.  , Disp: , Rfl:  propylthiouracil (PTU) 50 MG tablet, Take 50 mg by mouth daily.  , Disp: , Rfl:   Review of Systems  Review of Systems  Constitutional: Negative for fever, chills, weight loss, malaise/fatigue and diaphoresis.  HENT: Negative for hearing loss, ear pain, nosebleeds, congestion, sore throat, neck pain, tinnitus and ear discharge.   Eyes: Negative for blurred vision, double vision, photophobia, pain, discharge and redness.  Respiratory: Negative for cough, hemoptysis, sputum production, shortness of breath, wheezing and stridor.   Cardiovascular: Negative for chest pain, palpitations, orthopnea, claudication, leg swelling and PND.  Gastrointestinal: negative for abdominal pain. Negative for heartburn, nausea, vomiting, diarrhea, constipation, blood in stool and melena.  Genitourinary: Negative for dysuria, urgency, frequency, hematuria and flank pain.  Musculoskeletal: Negative for myalgias, back pain, joint pain and falls.  Skin: Negative for itching and rash.  Neurological: Negative for dizziness, tingling, tremors, sensory change, speech change, focal weakness, seizures, loss of consciousness, weakness and headaches.  Endo/Heme/Allergies: Negative for environmental allergies and polydipsia. Does not bruise/bleed easily.  Psychiatric/Behavioral: Negative for depression, suicidal ideas, hallucinations, memory loss and substance abuse. The patient is not nervous/anxious  and does not have insomnia.        Objective:  Blood pressure 100/80, height  (1.397 m), weight 142 lb (64.411 kg), last menstrual period 01/26/2014.   Physical Exam  Vitals reviewed. Constitutional: She is oriented to person, place, and time. She appears well-developed and well-nourished.  HENT:  Head: Normocephalic and atraumatic.        Right Ear: External ear normal.  Left Ear: External ear normal.  Nose: Nose normal.  Mouth/Throat: Oropharynx is clear and moist.  Eyes: Conjunctivae and EOM are normal. Pupils are equal, round, and reactive to light. Right eye exhibits no discharge. Left eye exhibits no discharge. No scleral icterus.  Neck: Normal range of motion. Neck supple. No tracheal deviation present. No thyromegaly present.  Cardiovascular: Normal rate, regular rhythm, normal heart sounds and intact distal pulses.  Exam reveals no gallop and no friction rub.   No murmur heard. Respiratory: Effort normal and breath sounds normal. No respiratory distress. She has no wheezes. She has no rales. She exhibits no tenderness.  GI: Soft. Bowel sounds are normal. She exhibits no distension and no mass. There is no tenderness. There is no rebound and no guarding.  Genitourinary:  Breasts no masses skin changes or nipple changes bilaterally      Vulva is normal without lesions Vagina is pink moist without discharge Cervix normal in appearance and pap is done Uterus is normal size shape and contour Adnexa is negative with normal sized ovaries    Musculoskeletal: Normal range of motion. She exhibits no edema and no tenderness.  Neurological: She is alert and oriented to person, place, and time. She has normal reflexes. She displays normal reflexes. No cranial nerve deficit. She exhibits normal muscle tone. Coordination normal.  Skin: Skin is warm and dry. No rash noted. No erythema. No pallor.  Psychiatric: She has a normal mood and affect. Her behavior is normal. Judgment and  thought content normal.       Assessment:    Healthy female exam.   hypothyroid Plan:    Contraception: OCP (estrogen/progesterone). Follow up in: 1 year.

## 2014-02-12 LAB — CYTOLOGY - PAP

## 2014-02-13 ENCOUNTER — Encounter (HOSPITAL_COMMUNITY): Payer: Self-pay | Admitting: Obstetrics and Gynecology

## 2014-07-10 ENCOUNTER — Encounter (HOSPITAL_COMMUNITY): Payer: Self-pay | Admitting: Obstetrics and Gynecology

## 2015-01-16 ENCOUNTER — Other Ambulatory Visit: Payer: Self-pay | Admitting: Obstetrics & Gynecology

## 2016-01-15 ENCOUNTER — Other Ambulatory Visit: Payer: Self-pay | Admitting: Obstetrics & Gynecology

## 2016-01-17 ENCOUNTER — Other Ambulatory Visit: Payer: Self-pay | Admitting: Obstetrics & Gynecology

## 2016-12-16 ENCOUNTER — Other Ambulatory Visit: Payer: Self-pay | Admitting: Obstetrics & Gynecology

## 2016-12-30 DIAGNOSIS — E6609 Other obesity due to excess calories: Secondary | ICD-10-CM | POA: Diagnosis not present

## 2016-12-30 DIAGNOSIS — Z1389 Encounter for screening for other disorder: Secondary | ICD-10-CM | POA: Diagnosis not present

## 2016-12-30 DIAGNOSIS — Z6830 Body mass index (BMI) 30.0-30.9, adult: Secondary | ICD-10-CM | POA: Diagnosis not present

## 2016-12-30 DIAGNOSIS — J069 Acute upper respiratory infection, unspecified: Secondary | ICD-10-CM | POA: Diagnosis not present

## 2017-02-10 DIAGNOSIS — E119 Type 2 diabetes mellitus without complications: Secondary | ICD-10-CM | POA: Diagnosis not present

## 2017-02-10 DIAGNOSIS — I471 Supraventricular tachycardia: Secondary | ICD-10-CM | POA: Diagnosis not present

## 2017-02-10 DIAGNOSIS — E1165 Type 2 diabetes mellitus with hyperglycemia: Secondary | ICD-10-CM | POA: Diagnosis not present

## 2017-02-10 DIAGNOSIS — Z23 Encounter for immunization: Secondary | ICD-10-CM | POA: Diagnosis not present

## 2017-02-10 DIAGNOSIS — E039 Hypothyroidism, unspecified: Secondary | ICD-10-CM | POA: Diagnosis not present

## 2017-02-10 DIAGNOSIS — Z1389 Encounter for screening for other disorder: Secondary | ICD-10-CM | POA: Diagnosis not present

## 2017-02-10 DIAGNOSIS — E782 Mixed hyperlipidemia: Secondary | ICD-10-CM | POA: Diagnosis not present

## 2017-02-10 DIAGNOSIS — Z6830 Body mass index (BMI) 30.0-30.9, adult: Secondary | ICD-10-CM | POA: Diagnosis not present

## 2017-02-10 DIAGNOSIS — E6609 Other obesity due to excess calories: Secondary | ICD-10-CM | POA: Diagnosis not present

## 2017-07-31 DIAGNOSIS — Z1389 Encounter for screening for other disorder: Secondary | ICD-10-CM | POA: Diagnosis not present

## 2017-07-31 DIAGNOSIS — E039 Hypothyroidism, unspecified: Secondary | ICD-10-CM | POA: Diagnosis not present

## 2017-07-31 DIAGNOSIS — E782 Mixed hyperlipidemia: Secondary | ICD-10-CM | POA: Diagnosis not present

## 2017-07-31 DIAGNOSIS — E1165 Type 2 diabetes mellitus with hyperglycemia: Secondary | ICD-10-CM | POA: Diagnosis not present

## 2017-07-31 DIAGNOSIS — Z6829 Body mass index (BMI) 29.0-29.9, adult: Secondary | ICD-10-CM | POA: Diagnosis not present

## 2017-10-30 DIAGNOSIS — E6609 Other obesity due to excess calories: Secondary | ICD-10-CM | POA: Diagnosis not present

## 2017-10-30 DIAGNOSIS — E0500 Thyrotoxicosis with diffuse goiter without thyrotoxic crisis or storm: Secondary | ICD-10-CM | POA: Diagnosis not present

## 2017-10-30 DIAGNOSIS — E890 Postprocedural hypothyroidism: Secondary | ICD-10-CM | POA: Diagnosis not present

## 2017-11-17 DIAGNOSIS — E0591 Thyrotoxicosis, unspecified with thyrotoxic crisis or storm: Secondary | ICD-10-CM | POA: Diagnosis not present

## 2017-11-17 DIAGNOSIS — E6609 Other obesity due to excess calories: Secondary | ICD-10-CM | POA: Diagnosis not present

## 2017-11-17 DIAGNOSIS — E0500 Thyrotoxicosis with diffuse goiter without thyrotoxic crisis or storm: Secondary | ICD-10-CM | POA: Diagnosis not present

## 2017-11-17 DIAGNOSIS — E890 Postprocedural hypothyroidism: Secondary | ICD-10-CM | POA: Diagnosis not present

## 2017-11-17 DIAGNOSIS — E1165 Type 2 diabetes mellitus with hyperglycemia: Secondary | ICD-10-CM | POA: Diagnosis not present

## 2017-11-17 DIAGNOSIS — Z6830 Body mass index (BMI) 30.0-30.9, adult: Secondary | ICD-10-CM | POA: Diagnosis not present

## 2017-11-22 DIAGNOSIS — E782 Mixed hyperlipidemia: Secondary | ICD-10-CM | POA: Diagnosis not present

## 2017-11-22 DIAGNOSIS — E039 Hypothyroidism, unspecified: Secondary | ICD-10-CM | POA: Diagnosis not present

## 2017-11-22 DIAGNOSIS — Z1389 Encounter for screening for other disorder: Secondary | ICD-10-CM | POA: Diagnosis not present

## 2017-11-22 DIAGNOSIS — E1165 Type 2 diabetes mellitus with hyperglycemia: Secondary | ICD-10-CM | POA: Diagnosis not present

## 2017-11-22 DIAGNOSIS — Z6830 Body mass index (BMI) 30.0-30.9, adult: Secondary | ICD-10-CM | POA: Diagnosis not present

## 2017-11-22 DIAGNOSIS — Z23 Encounter for immunization: Secondary | ICD-10-CM | POA: Diagnosis not present

## 2018-01-12 ENCOUNTER — Other Ambulatory Visit: Payer: Self-pay | Admitting: Obstetrics & Gynecology

## 2018-06-04 DIAGNOSIS — Z1389 Encounter for screening for other disorder: Secondary | ICD-10-CM | POA: Diagnosis not present

## 2018-06-04 DIAGNOSIS — E6609 Other obesity due to excess calories: Secondary | ICD-10-CM | POA: Diagnosis not present

## 2018-06-04 DIAGNOSIS — J45909 Unspecified asthma, uncomplicated: Secondary | ICD-10-CM | POA: Diagnosis not present

## 2018-06-04 DIAGNOSIS — Z6830 Body mass index (BMI) 30.0-30.9, adult: Secondary | ICD-10-CM | POA: Diagnosis not present

## 2018-06-13 DIAGNOSIS — Z6829 Body mass index (BMI) 29.0-29.9, adult: Secondary | ICD-10-CM | POA: Diagnosis not present

## 2018-06-13 DIAGNOSIS — E663 Overweight: Secondary | ICD-10-CM | POA: Diagnosis not present

## 2018-06-13 DIAGNOSIS — Z1389 Encounter for screening for other disorder: Secondary | ICD-10-CM | POA: Diagnosis not present

## 2018-06-13 DIAGNOSIS — J069 Acute upper respiratory infection, unspecified: Secondary | ICD-10-CM | POA: Diagnosis not present

## 2018-11-02 DIAGNOSIS — E1165 Type 2 diabetes mellitus with hyperglycemia: Secondary | ICD-10-CM | POA: Diagnosis not present

## 2018-11-02 DIAGNOSIS — Z23 Encounter for immunization: Secondary | ICD-10-CM | POA: Diagnosis not present

## 2018-11-02 DIAGNOSIS — E663 Overweight: Secondary | ICD-10-CM | POA: Diagnosis not present

## 2018-11-02 DIAGNOSIS — Z6827 Body mass index (BMI) 27.0-27.9, adult: Secondary | ICD-10-CM | POA: Diagnosis not present

## 2018-11-02 DIAGNOSIS — E039 Hypothyroidism, unspecified: Secondary | ICD-10-CM | POA: Diagnosis not present

## 2018-11-02 DIAGNOSIS — I471 Supraventricular tachycardia: Secondary | ICD-10-CM | POA: Diagnosis not present

## 2018-11-02 DIAGNOSIS — E7849 Other hyperlipidemia: Secondary | ICD-10-CM | POA: Diagnosis not present

## 2018-11-12 DIAGNOSIS — E0500 Thyrotoxicosis with diffuse goiter without thyrotoxic crisis or storm: Secondary | ICD-10-CM | POA: Diagnosis not present

## 2018-11-12 DIAGNOSIS — E1165 Type 2 diabetes mellitus with hyperglycemia: Secondary | ICD-10-CM | POA: Diagnosis not present

## 2018-11-12 DIAGNOSIS — E6609 Other obesity due to excess calories: Secondary | ICD-10-CM | POA: Diagnosis not present

## 2018-11-12 DIAGNOSIS — E890 Postprocedural hypothyroidism: Secondary | ICD-10-CM | POA: Diagnosis not present

## 2018-11-12 DIAGNOSIS — K219 Gastro-esophageal reflux disease without esophagitis: Secondary | ICD-10-CM | POA: Diagnosis not present

## 2018-11-20 DIAGNOSIS — E0591 Thyrotoxicosis, unspecified with thyrotoxic crisis or storm: Secondary | ICD-10-CM | POA: Diagnosis not present

## 2018-11-20 DIAGNOSIS — E1165 Type 2 diabetes mellitus with hyperglycemia: Secondary | ICD-10-CM | POA: Diagnosis not present

## 2018-11-20 DIAGNOSIS — E6609 Other obesity due to excess calories: Secondary | ICD-10-CM | POA: Diagnosis not present

## 2018-11-20 DIAGNOSIS — E890 Postprocedural hypothyroidism: Secondary | ICD-10-CM | POA: Diagnosis not present

## 2018-11-23 DIAGNOSIS — E119 Type 2 diabetes mellitus without complications: Secondary | ICD-10-CM | POA: Diagnosis not present

## 2018-12-24 ENCOUNTER — Telehealth: Payer: Self-pay | Admitting: *Deleted

## 2018-12-24 NOTE — Telephone Encounter (Signed)
LVM for pt to call to schedule her pap/physical.

## 2018-12-24 NOTE — Telephone Encounter (Signed)
Pt left message that she wants to know when she is due for a pap smear. Per chart review last pap was in 2016.

## 2019-01-14 ENCOUNTER — Other Ambulatory Visit: Payer: Self-pay | Admitting: Obstetrics & Gynecology

## 2019-02-11 DIAGNOSIS — Z6827 Body mass index (BMI) 27.0-27.9, adult: Secondary | ICD-10-CM | POA: Diagnosis not present

## 2019-02-11 DIAGNOSIS — E039 Hypothyroidism, unspecified: Secondary | ICD-10-CM | POA: Diagnosis not present

## 2019-02-11 DIAGNOSIS — J45909 Unspecified asthma, uncomplicated: Secondary | ICD-10-CM | POA: Diagnosis not present

## 2019-02-11 DIAGNOSIS — Z1389 Encounter for screening for other disorder: Secondary | ICD-10-CM | POA: Diagnosis not present

## 2019-02-11 DIAGNOSIS — E7849 Other hyperlipidemia: Secondary | ICD-10-CM | POA: Diagnosis not present

## 2019-02-11 DIAGNOSIS — E1165 Type 2 diabetes mellitus with hyperglycemia: Secondary | ICD-10-CM | POA: Diagnosis not present

## 2019-02-11 DIAGNOSIS — Z23 Encounter for immunization: Secondary | ICD-10-CM | POA: Diagnosis not present

## 2019-02-12 DIAGNOSIS — J45909 Unspecified asthma, uncomplicated: Secondary | ICD-10-CM | POA: Diagnosis not present

## 2019-02-12 DIAGNOSIS — Z23 Encounter for immunization: Secondary | ICD-10-CM | POA: Diagnosis not present

## 2019-02-12 DIAGNOSIS — E1165 Type 2 diabetes mellitus with hyperglycemia: Secondary | ICD-10-CM | POA: Diagnosis not present

## 2019-02-12 DIAGNOSIS — Z1389 Encounter for screening for other disorder: Secondary | ICD-10-CM | POA: Diagnosis not present

## 2019-02-28 ENCOUNTER — Telehealth: Payer: Self-pay | Admitting: Adult Health

## 2019-02-28 NOTE — Telephone Encounter (Signed)

## 2019-03-01 ENCOUNTER — Other Ambulatory Visit: Payer: Self-pay

## 2019-03-01 ENCOUNTER — Other Ambulatory Visit (HOSPITAL_COMMUNITY)
Admission: RE | Admit: 2019-03-01 | Discharge: 2019-03-01 | Disposition: A | Discharge status: Home / Self Care | Payer: BC Managed Care – PPO | Source: Ambulatory Visit | Attending: Adult Health | Admitting: Adult Health

## 2019-03-01 ENCOUNTER — Ambulatory Visit (INDEPENDENT_AMBULATORY_CARE_PROVIDER_SITE_OTHER): Payer: BC Managed Care – PPO | Admitting: Adult Health

## 2019-03-01 ENCOUNTER — Encounter: Payer: Self-pay | Admitting: Adult Health

## 2019-03-01 VITALS — BP 129/80 | HR 78 | Ht <= 58 in | Wt 128.5 lb

## 2019-03-01 DIAGNOSIS — Z01419 Encounter for gynecological examination (general) (routine) without abnormal findings: Secondary | ICD-10-CM | POA: Insufficient documentation

## 2019-03-01 DIAGNOSIS — Z3041 Encounter for surveillance of contraceptive pills: Secondary | ICD-10-CM | POA: Diagnosis not present

## 2019-03-01 DIAGNOSIS — Z3202 Encounter for pregnancy test, result negative: Secondary | ICD-10-CM | POA: Diagnosis not present

## 2019-03-01 LAB — POCT URINE PREGNANCY: Preg Test, Ur: NEGATIVE

## 2019-03-01 MED ORDER — NORGESTIM-ETH ESTRAD TRIPHASIC 0.18/0.215/0.25 MG-35 MCG PO TABS: 1.0000 | ORAL_TABLET | Freq: Every day | ORAL | 4 refills | Status: DC

## 2019-03-01 NOTE — Progress Notes (Addendum)
Patient ID: Christy Harmon, female   DOB: 1980/10/04, 39 y.o.   MRN: [id number] History of Present Illness:  Christy Harmon is a 39 year old black female, married, G1P1 in for well woman gyn exam and pap.She works second shift [employer]. PCP is Dr Phillips Odor.   Current Medications, Allergies, Past Medical History, Past Surgical History, Family History and Social History were reviewed in [employer] record.     Review of Systems: Skin warm and dry. Neck: mid line trachea, normal thyroid, good ROM, no lymphadenopathy noted. Lungs: clear to ausculation bilaterally. Cardiovascular: regular rate and rhythm.    Physical Exam:BP 129/80 (BP Location: Left Arm, Patient Position: Sitting, Cuff Size: Normal)   Pulse 78   Ht 4\' 8"  (1.422 m)   Wt 128 lb 8 oz (58.3 kg)   LMP 02/11/2019   BMI 28.81 kg/m UPT is negative. General:  Well developed, well nourished, no acute distress Skin:  Warm and dry Neck:  Midline trachea, normal thyroid, good ROM, no lymphadenopathy Lungs; Clear to auscultation bilaterally Breast:  No dominant palpable mass, retraction, or nipple discharge Cardiovascular: Regular rate and rhythm Abdomen:  Soft, non tender, no hepatosplenomegaly Pelvic:  External genitalia is normal in appearance, no lesions.  The vagina is normal in appearance. Urethra has no lesions or masses. The cervix is everted at os, pap with GC/CHL and high risk HPV 16/18 genotyping performed .  Uterus is felt to be normal size, shape, and contour.  No adnexal masses or tenderness noted.Bladder is non tender, no masses felt. Extremities/musculoskeletal:  No swelling or varicosities noted, no clubbing or cyanosis Psych:  No mood changes, alert and cooperative,seems happy Fall risk is low PHQ 2 score is 0. Examination chaperoned by 04/11/2019 Rash LPN.  Impression and Plan:  1. Encounter for gynecological examination with Papanicolaou smear of cervix Pap sent Physical in  1 year Pap in 3 if  normal Mammogram at 41  2. Encounter for surveillance of contraceptive pills Meds ordered this encounter  Medications  . Norgestimate-Ethinyl Estradiol Triphasic (TRI-SPRINTEC) 0.18/0.215/0.25 MG-35 MCG tablet    Sig: Take 1 tablet by mouth daily.    Dispense:  84 tablet    Refill:  4    Order Specific Question:   Supervising Provider    Answer:   02-28-1980 H [[id number]]

## 2019-03-06 LAB — CYTOLOGY - PAP
Chlamydia: NEGATIVE
Comment: NEGATIVE
Comment: NEGATIVE
Comment: NORMAL
Diagnosis: NEGATIVE
High risk HPV: NEGATIVE
Neisseria Gonorrhea: NEGATIVE

## 2019-03-08 ENCOUNTER — Encounter (HOSPITAL_COMMUNITY): Payer: Self-pay

## 2019-03-08 ENCOUNTER — Emergency Department (HOSPITAL_COMMUNITY)
Admission: EM | Admit: 2019-03-08 | Discharge: 2019-03-08 | Disposition: A | Discharge status: Home / Self Care | Payer: BC Managed Care – PPO | Attending: Emergency Medicine | Admitting: Emergency Medicine

## 2019-03-08 ENCOUNTER — Emergency Department (HOSPITAL_COMMUNITY): Payer: BC Managed Care – PPO

## 2019-03-08 ENCOUNTER — Other Ambulatory Visit: Payer: Self-pay

## 2019-03-08 DIAGNOSIS — J45909 Unspecified asthma, uncomplicated: Secondary | ICD-10-CM | POA: Diagnosis not present

## 2019-03-08 DIAGNOSIS — Z6827 Body mass index (BMI) 27.0-27.9, adult: Secondary | ICD-10-CM | POA: Diagnosis not present

## 2019-03-08 DIAGNOSIS — R0789 Other chest pain: Secondary | ICD-10-CM | POA: Diagnosis not present

## 2019-03-08 DIAGNOSIS — E1165 Type 2 diabetes mellitus with hyperglycemia: Secondary | ICD-10-CM | POA: Diagnosis not present

## 2019-03-08 DIAGNOSIS — Z7984 Long term (current) use of oral hypoglycemic drugs: Secondary | ICD-10-CM | POA: Diagnosis not present

## 2019-03-08 DIAGNOSIS — R079 Chest pain, unspecified: Secondary | ICD-10-CM | POA: Diagnosis not present

## 2019-03-08 DIAGNOSIS — Z79899 Other long term (current) drug therapy: Secondary | ICD-10-CM | POA: Diagnosis not present

## 2019-03-08 DIAGNOSIS — E663 Overweight: Secondary | ICD-10-CM | POA: Diagnosis not present

## 2019-03-08 DIAGNOSIS — I471 Supraventricular tachycardia: Secondary | ICD-10-CM | POA: Diagnosis not present

## 2019-03-08 DIAGNOSIS — R072 Precordial pain: Secondary | ICD-10-CM | POA: Diagnosis not present

## 2019-03-08 DIAGNOSIS — E119 Type 2 diabetes mellitus without complications: Secondary | ICD-10-CM | POA: Diagnosis not present

## 2019-03-08 DIAGNOSIS — M940 Chondrocostal junction syndrome [Tietze]: Secondary | ICD-10-CM | POA: Diagnosis not present

## 2019-03-08 DIAGNOSIS — E039 Hypothyroidism, unspecified: Secondary | ICD-10-CM | POA: Diagnosis not present

## 2019-03-08 HISTORY — DX: Unspecified osteoarthritis, unspecified site: M19.90

## 2019-03-08 LAB — CBC
HCT: 39.3 % (ref 36.0–46.0)
Hemoglobin: 13 g/dL (ref 12.0–15.0)
MCH: 31.8 pg (ref 26.0–34.0)
MCHC: 33.1 g/dL (ref 30.0–36.0)
MCV: 96.1 fL (ref 80.0–100.0)
Platelets: 312 10*3/uL (ref 150–400)
RBC: 4.09 MIL/uL (ref 3.87–5.11)
RDW: 13.9 % (ref 11.5–15.5)
WBC: 11.4 10*3/uL — ABNORMAL HIGH (ref 4.0–10.5)
nRBC: 0 % (ref 0.0–0.2)

## 2019-03-08 LAB — BASIC METABOLIC PANEL
Anion gap: 11 (ref 5–15)
BUN: 9 mg/dL (ref 6–20)
CO2: 25 mmol/L (ref 22–32)
Calcium: 8.7 mg/dL — ABNORMAL LOW (ref 8.9–10.3)
Chloride: 100 mmol/L (ref 98–111)
Creatinine, Ser: 0.69 mg/dL (ref 0.44–1.00)
GFR calc Af Amer: >60 mL/min (ref >60)
GFR calc non Af Amer: >60 mL/min (ref >60)
Glucose, Bld: 113 mg/dL — ABNORMAL HIGH (ref 70–99)
Potassium: 3.5 mmol/L (ref 3.5–5.1)
Sodium: 136 mmol/L (ref 135–145)

## 2019-03-08 LAB — CBG MONITORING, ED: Glucose-Capillary: 97 mg/dL (ref 70–99)

## 2019-03-08 LAB — HCG, QUANTITATIVE, PREGNANCY: hCG, Beta Chain, Quant, S: <1 m[IU]/mL (ref <5)

## 2019-03-08 LAB — TROPONIN I (HIGH SENSITIVITY): Troponin I (High Sensitivity): <2 ng/L (ref <18)

## 2019-03-08 MED ORDER — IBUPROFEN 600 MG PO TABS: 600.0000 mg | ORAL_TABLET | Freq: Three times a day (TID) | ORAL | 0 refills | Status: DC | PRN

## 2019-03-08 NOTE — ED Triage Notes (Signed)
Pt presents to ED for left sided chest pain since Wednesday. Pt states pain is constant since then, denies radiation of pain. Pt states pain feels like an ache. Pt denies SOB, dizziness, weakness. Pt was seen by Dr Phillips Odor this am and sent to ED. Pt given 1 nitro by Dr Phillips Odor and 1 ASA prior to arrival by Dr Phillips Odor.

## 2019-03-08 NOTE — ED Provider Notes (Signed)
[place] Provider Note   CSN: [id number] Arrival date & time: 03/08/19  0848     History Chief Complaint  Patient presents with  . Chest Pain    Sequoya MINNETTA Christy Harmon is a 39 y.o. female with a history of DM and hyperthyroidism, presenting from her pcp's office for further evaluation of midsternal chest pain described as soreness which is constant and worsened with palpation, starting gradually 2 days ago.  She denies n/v, sob, diaphoresis, palpitations, fevers, cough, denies pleuritic pain.  She also denies peripheral edema or leg pain. She was seen by her pcp prior to arrival here and given ASA 324 mg and one ntg tablet which she states maybe improved her pain slightly.  No family hx of early cardiac disease, reports DM under good control.  She has had no other tx prior to arrival.  The history is provided by the patient.       Past Medical History:  Diagnosis Date  . Arthritis   . Diabetes mellitus   . Hyperthyroidism   . Preterm labor     Patient Active Problem List   Diagnosis Date Noted  . Encounter for gynecological examination with Papanicolaou smear of cervix 03/01/2019  . Encounter for surveillance of contraceptive pills 03/01/2019  . Diabetes ([place]) 01/21/2013  . Routine gynecological examination 01/21/2013    Past Surgical History:  Procedure Laterality Date  . CESAREAN SECTION    . CESAREAN SECTION  08/22/2010   Procedure: CESAREAN SECTION;  Surgeon: Jonnie Kind, MD;  Location: [facility];  Service: Gynecology;  Laterality: N/A;  primary      OB History    Gravida  1   Para  1   Term  1   Preterm      AB  0   Living  1     SAB  0   TAB      Ectopic      Multiple      Live Births  1           Family History  Problem Relation Age of Onset  . Diabetes Mother   . Diabetes Brother   . Hypertension Father     Social History   Tobacco Use  . Smoking status: Never Smoker  . Smokeless tobacco: Never Used    Substance Use Topics  . Alcohol use: No  . Drug use: No    Home Medications Prior to Admission medications   Medication Sig Start Date End Date Taking? Authorizing Provider  AD[facility] [phone] MCG/ACT inhaler SMARTSIG:2 Puff(s) Via Inhaler Morning-Night 12/10/18  Yes [provider]  Loratadine (CLARITIN PO) Take by mouth daily.   Yes [provider]  metFORMIN (GLUCOPHAGE) 500 MG tablet Take 500 mg by mouth 2 (two) times daily. 500 mg in the morning, 250 mg pm 02/04/19  Yes [provider]  Norgestimate-Ethinyl Estradiol Triphasic (TRI-SPRINTEC) 0.18/0.215/0.25 MG-35 MCG tablet Take 1 tablet by mouth daily. 03/01/19  Yes Derrek Monaco A, NP  omeprazole (PRILOSEC) 20 MG capsule Take 20 mg by mouth daily.     Yes [provider]  prenatal vitamin w/FE, FA (PRENATAL 1 + 1) 27-1 MG TABS Take 1 tablet by mouth daily.     Yes [provider]  SYNTHROID 75 MCG tablet Take 75 mcg by mouth daily. 02/17/19  Yes [provider]  ibuprofen (ADVIL) 600 MG tablet Take 1 tablet (600 mg total) by mouth every 8 (eight) hours as needed.  03/08/19   Burgess Amor, PA-C    Allergies    Patient has no known allergies.  Review of Systems   Review of Systems  Constitutional: Negative for chills and fever.  HENT: Negative for congestion and sore throat.   Eyes: Negative.   Respiratory: Negative for chest tightness and shortness of breath.   Cardiovascular: Positive for chest pain. Negative for palpitations and leg swelling.  Gastrointestinal: Negative for abdominal pain, nausea and vomiting.  Genitourinary: Negative.   Musculoskeletal: Negative for arthralgias, joint swelling and neck pain.  Skin: Negative.  Negative for rash and wound.  Neurological: Negative for dizziness, weakness, light-headedness, numbness and headaches.  Psychiatric/Behavioral: Negative.     Physical Exam Updated Vital Signs BP 137/89   Pulse 78   Temp 98.4 F (36.9 C) (Oral)    Resp (!) 22   Ht 4\' 8"  (1.422 m)   Wt 58.1 kg   LMP 02/14/2019   SpO2 100%   BMI 28.70 kg/m   Physical Exam Vitals and nursing note reviewed.  Constitutional:      Appearance: She is well-developed.  HENT:     Head: Normocephalic and atraumatic.  Eyes:     Conjunctiva/sclera: Conjunctivae normal.  Cardiovascular:     Rate and Rhythm: Normal rate and regular rhythm.     Heart sounds: Normal heart sounds.  Pulmonary:     Effort: Pulmonary effort is normal.     Breath sounds: Normal breath sounds. No wheezing.  Chest:     Chest wall: Tenderness present.       Comments: Reproducible tenderness midsternum. Abdominal:     General: Bowel sounds are normal.     Palpations: Abdomen is soft.     Tenderness: There is no abdominal tenderness.  Musculoskeletal:        General: Normal range of motion.     Cervical back: Normal range of motion.     Right lower leg: No tenderness. No edema.     Left lower leg: No tenderness. No edema.  Skin:    General: Skin is warm and dry.  Neurological:     Mental Status: She is alert.     ED Results / Procedures / Treatments   Labs (all labs ordered are listed, but only abnormal results are displayed) Labs Reviewed  BASIC METABOLIC PANEL - Abnormal; Notable for the following components:      Result Value   Glucose, Bld 113 (*)    Calcium 8.7 (*)    All other components within normal limits  CBC - Abnormal; Notable for the following components:   WBC 11.4 (*)    All other components within normal limits  HCG, QUANTITATIVE, PREGNANCY  CBG MONITORING, ED  TROPONIN I (HIGH SENSITIVITY)    EKG EKG Interpretation  Date/Time:  Friday March 08 2019 09:02:24 EST Ventricular Rate:  89 PR Interval:    QRS Duration: 71 QT Interval:  374 QTC Calculation: 456 R Axis:   28 Text Interpretation: Sinus rhythm Low voltage, extremity and precordial leads No STEMI Confirmed by [id number] [phone]) on 03/08/2019 9:07:32 AM   Radiology DG  Chest Portable 1 View  Result Date: 03/08/2019 CLINICAL DATA:  Chest pain. EXAM: PORTABLE CHEST 1 VIEW COMPARISON:  No prior. FINDINGS: Mediastinum hilar structures normal. Mild left base subsegmental atelectasis. No pleural effusion or pneumothorax. Heart size normal. No Acute bony abnormality. IMPRESSION: Mild left base subsegmental atelectasis otherwise negative exam. Electronically Signed   By: 05/06/2019  Register   On: 03/08/2019  09:34    Procedures Procedures (including critical care time)  Medications Ordered in ED Medications - No data to display  ED Course  I have reviewed the triage vital signs and the nursing notes.  Pertinent labs & imaging results that were available during my care of the patient were reviewed by me and considered in my medical decision making (see chart for details).    MDM Rules/Calculators/A&P                      Patient has reproducible midsternal chest pain suggesting a chest wall source.  She does endorse a job which requires frequent lifting and sometimes trays are bumped against her chest wall with her job.  Labs including a negative troponin, chest x-ray and EKG today are negative, there is no evidence for her symptoms being of cardiac origin.  She also has no pleurisy, no shortness of breath or hypoxia.  Her vital signs are stable during this ED visit.  She was encouraged ibuprofen and heat therapy with as needed follow-up with her PCP if symptoms persist or worsen. Final Clinical Impression(s) / ED Diagnoses Final diagnoses:  Chest wall pain    Rx / DC Orders ED Discharge Orders         Ordered    ibuprofen (ADVIL) 600 MG tablet  Every 8 hours PRN     03/08/19 1126           Burgess Amor, PA-C 03/08/19 1128    Terald Sleeper, MD 03/08/19 2048

## 2019-03-08 NOTE — Discharge Instructions (Signed)
Your lab tests, chest xray and ekg findings  as well as your exam are reassuring.  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  I recommend a heating pad applied to your chest wall at the site of pain for 20 minutes several times daily.  You may also benefit from an anti inflammatory (ibuprofen) which has been prescribed.

## 2019-03-28 ENCOUNTER — Telehealth: Payer: Self-pay

## 2019-03-28 ENCOUNTER — Telehealth (INDEPENDENT_AMBULATORY_CARE_PROVIDER_SITE_OTHER): Payer: BC Managed Care – PPO | Admitting: Cardiology

## 2019-03-28 ENCOUNTER — Encounter: Payer: Self-pay | Admitting: Cardiology

## 2019-03-28 ENCOUNTER — Other Ambulatory Visit: Payer: Self-pay

## 2019-03-28 VITALS — BP 112/77 | HR 86 | Ht <= 58 in | Wt 129.0 lb

## 2019-03-28 DIAGNOSIS — R079 Chest pain, unspecified: Secondary | ICD-10-CM

## 2019-03-28 NOTE — Progress Notes (Signed)
Virtual Visit via Telephone Note   This visit type was conducted due to national recommendations for restrictions regarding the COVID-19 Pandemic (e.g. social distancing) in an effort to limit this patient's exposure and mitigate transmission in our community.  Due to her co-morbid illnesses, this patient is at least at moderate risk for complications without adequate follow up.  This format is felt to be most appropriate for this patient at this time.  The patient did not have access to video technology/had technical difficulties with video requiring transitioning to audio format only (telephone).  All issues noted in this document were discussed and addressed.  No physical exam could be performed with this format.  Please refer to the patient's chart for her  consent to telehealth for [facility].   Evaluation Performed:  Cardiology Consult  This visit type was conducted due to national recommendations for restrictions regarding the COVID-19 Pandemic (e.g. social distancing).  This format is felt to be most appropriate for this patient at this time.  All issues noted in this document were discussed and addressed.  No physical exam was performed (except for noted visual exam findings with Video Visits).  Please refer to the patient's chart (MyChart message for video visits and phone note for telephone visits) for the patient's consent to telehealth for [facility].  Date:  03/28/2019   ID:  Christy Harmon, DOB November 16, 1980, MRN [id number]  Patient Location:  Home  Provider location:   [place]  PCP:  Terie Purser, PA Cardiologist:  Armanda Magic, MD ([place]) Electrophysiologist:  None   Chief Complaint:  Chest pain  History of Present Illness:    Christy Harmon is a 39 y.o. female who presents via audio/video conferencing for a telehealth visit today in referral by Terie Purser, PA for evaluation of chest pain.  This is a 39yo female with a hx of asthma, DM, Tietze's  disease and hyperthyroidism.  She has been referred for evaluation of chest pain.   She says that it felt like a bruise on her chest.  She went to her PCP with midsternal pain that she described soreness that was constant and worse with palpation with no associated sx of Nausea/diaphoresis or SOB.  She was sent to the ER and EKG was normal and it was felt that she had musculoskeletal pain.  She has a job that requires a lot of lifting and sometimes bumps trays against her chest wall.  Her hsTrop was normal and cxray was normal.  SHh was instructed to take Ibuprofen and  CP completely resolved. She has not had any chest pain, SOB, DOE, LE edema, dizziness, palpitations or syncope.    The patient does not have symptoms concerning for COVID-19 infection (fever, chills, cough, or new shortness of breath).    Prior CV studies:   The following studies were reviewed today:  ER visit notes, labs from hospital and EKG  Past Medical History:  Diagnosis Date  . Arthritis   . Asthma   . Atrial tachycardia (HCC)   . BMI 27.0-27.9,adult   . Chest pain   . Diabetes mellitus   . Hyperthyroidism   . Overweight   . Preterm labor   . Tietze's disease    Past Surgical History:  Procedure Laterality Date  . CESAREAN SECTION    . CESAREAN SECTION  08/22/2010   Procedure: CESAREAN SECTION;  Surgeon: Tilda Burrow, MD;  Location: [facility];  Service: Gynecology;  Laterality: N/A;  primary  No outpatient medications have been marked as taking for the 03/28/19 encounter (Telemedicine) with Sueanne Margarita, MD.     Allergies:   Patient has no known allergies.   Social History   Tobacco Use  . Smoking status: Never Smoker  . Smokeless tobacco: Never Used  Substance Use Topics  . Alcohol use: No  . Drug use: No     Family Hx: The patient's family history includes Diabetes in her brother and mother; Hypertension in her father.  ROS:   Please see the history of present illness.     All other  systems reviewed and are negative.   Labs/Other Tests and Data Reviewed:    Recent Labs: 03/08/2019: BUN 9; Creatinine, Ser 0.69; Hemoglobin 13.0; Platelets 312; Potassium 3.5; Sodium 136   Recent Lipid Panel No results found for: CHOL, TRIG, HDL, CHOLHDL, LDLCALC, LDLDIRECT  Wt Readings from Last 3 Encounters:  03/28/19 129 lb (58.5 kg)  03/08/19 128 lb (58.1 kg)  03/01/19 128 lb 8 oz (58.3 kg)     Objective:    Vital Signs:  BP 112/77   Pulse 86   Ht 4\' 8"  (1.422 m)   Wt 129 lb (58.5 kg)   BMI 28.92 kg/m     ASSESSMENT & PLAN:    1.  Atypical hest pain -CP sx c/w musculoskeletal etiology - pain only occurred with palpation of her chest and completely resolved with ibuprofen.  There were no associated sx.  -pain has completely resolved -EKG normal -no CRF (never smoked and no fm hx of CAD) -no further cardiac workup indicated at this time  COVID-19 Education: The signs and symptoms of COVID-19 were discussed with the patient and how to seek care for testing (follow up with PCP or arrange E-visit).  The importance of social distancing was discussed today.  Patient Risk:   After full review of this patient's clinical status, I feel that they are at least moderate risk at this time.  Time:   Today, I have spent 20 minutes on telemedicine discussing medical problems including chest pain and reviewing patient's chart including OV notes from PCP.  Medication Adjustments/Labs and Tests Ordered: Current medicines are reviewed at length with the patient today.  Concerns regarding medicines are outlined above.  Tests Ordered: No orders of the defined types were placed in this encounter.  Medication Changes: No orders of the defined types were placed in this encounter.   Disposition:  Follow up prn  Signed, Fransico Him, MD  03/28/2019 9:08 AM    [place]

## 2019-03-28 NOTE — Patient Instructions (Signed)
Medication Instructions:  Your physician recommends that you continue on your current medications as directed. Please refer to the Current Medication list given to you today.  *If you need a refill on your cardiac medications before your next appointment, please call your pharmacy*  Follow-Up: At [facility], you and your health needs are our priority.  As part of our continuing mission to provide you with exceptional heart care, we have created designated Provider Care Teams.  These Care Teams include your primary Cardiologist (physician) and Advanced Practice Providers (APPs -  Physician Assistants and Nurse Practitioners) who all work together to provide you with the care you need, when you need it.  Follow up with Dr. Sailer as needed 

## 2019-03-28 NOTE — Telephone Encounter (Signed)
° ° °YOUR CARDIOLOGY TEAM HAS ARRANGED FOR AN E-VISIT FOR YOUR APPOINTMENT - PLEASE REVIEW IMPORTANT INFORMATION BELOW SEVERAL DAYS PRIOR TO YOUR APPOINTMENT ° °Due to the recent COVID-19 pandemic, we are transitioning in-person office visits to tele-medicine visits in an effort to decrease unnecessary exposure to our patients, their families, and staff. These visits are billed to your insurance just like a normal visit is. We also encourage you to sign up for MyChart if you have not already done so. You will need a smartphone if possible. For patients that do not have this, we can still complete the visit using a regular telephone but do prefer a smartphone to enable video when possible. You may have a family member that lives with you that can help. If possible, we also ask that you have a blood pressure cuff and scale at home to measure your blood pressure, heart rate and weight prior to your scheduled appointment. Patients with clinical needs that need an in-person evaluation and testing will still be able to come to the office if absolutely necessary. If you have any questions, feel free to call our office. ° ° ° °THE DAY OF YOUR APPOINTMENT ° °Approximately 15 minutes prior to your scheduled appointment, you will receive a telephone call from one of [facility] team - your caller ID may say "Unknown caller."  Our staff will confirm medications, vital signs for the day and any symptoms you may be experiencing. Please have this information available prior to the time of visit start. It may also be helpful for you to have a pad of paper and pen handy for any instructions given during your visit. They will also walk you through joining the smartphone meeting if this is a video visit. ° ° ° °CONSENT FOR TELE-HEALTH VISIT - PLEASE REVIEW ° °I hereby voluntarily request, consent and authorize [facility] and its employed or contracted physicians, physician assistants, nurse practitioners or other licensed health  care professionals (the Practitioner), to provide me with telemedicine health care services (the “Services") as deemed necessary by the treating Practitioner. I acknowledge and consent to receive the Services by the Practitioner via telemedicine. I understand that the telemedicine visit will involve communicating with the Practitioner through live audiovisual communication technology and the disclosure of certain medical information by electronic transmission. I acknowledge that I have been given the opportunity to request an in-person assessment or other available alternative prior to the telemedicine visit and am voluntarily participating in the telemedicine visit. ° °I understand that I have the right to withhold or withdraw my consent to the use of telemedicine in the course of my care at any time, without affecting my right to future care or treatment, and that the Practitioner or I may terminate the telemedicine visit at any time. I understand that I have the right to inspect all information obtained and/or recorded in the course of the telemedicine visit and may receive copies of available information for a reasonable fee.  I understand that some of the potential risks of receiving the Services via telemedicine include:  °• Delay or interruption in medical evaluation due to technological equipment failure or disruption; °• Information transmitted may not be sufficient (e.g. poor resolution of images) to allow for appropriate medical decision making by the Practitioner; and/or  °• In rare instances, security protocols could fail, causing a breach of personal health information. ° °Furthermore, I acknowledge that it is my responsibility to provide information about my medical history, conditions and care that   is complete and accurate to the best of my ability. I acknowledge that Practitioner's advice, recommendations, and/or decision may be based on factors not within their control, such as incomplete or  inaccurate data provided by me or distortions of diagnostic images or specimens that may result from electronic transmissions. I understand that the practice of medicine is not an exact science and that Practitioner makes no warranties or guarantees regarding treatment outcomes. I acknowledge that I will receive a copy of this consent concurrently upon execution via email to the email address I last provided but may also request a printed copy by calling the office of [facility].   ° °I understand that my insurance will be billed for this visit.  ° °I have read or had this consent read to me. °• I understand the contents of this consent, which adequately explains the benefits and risks of the Services being provided via telemedicine.  °• I have been provided ample opportunity to ask questions regarding this consent and the Services and have had my questions answered to my satisfaction. °• I give my informed consent for the services to be provided through the use of telemedicine in my medical care ° °By participating in this telemedicine visit I agree to the above. ° °

## 2019-04-11 ENCOUNTER — Ambulatory Visit: Payer: BC Managed Care – PPO | Attending: Internal Medicine

## 2019-04-11 DIAGNOSIS — Z23 Encounter for immunization: Secondary | ICD-10-CM

## 2019-04-11 NOTE — Progress Notes (Signed)
   Covid-19 Vaccination Clinic  Name:  Christy Harmon    MRN: [id number] DOB: [birth year]  04/11/2019  [name] was observed post Covid-19 immunization for 15 minutes without incident. She was provided with Vaccine Information Sheet and instruction to access the V-Safe system.   [name] was instructed to call 911 with any severe reactions post vaccine: Marland Kitchen Difficulty breathing  . Swelling of face and throat  . A fast heartbeat  . A bad rash all over body  . Dizziness and weakness   Immunizations Administered    Name Date Dose VIS Date Route   Moderna COVID-19 Vaccine 04/11/2019  9:02 AM 0.5 mL 01/01/2019 Intramuscular   Manufacturer: Moderna   Lot: [id number]   NDC: 25500-164-29

## 2019-05-14 ENCOUNTER — Ambulatory Visit: Payer: BC Managed Care – PPO | Attending: Internal Medicine

## 2019-05-14 DIAGNOSIS — Z23 Encounter for immunization: Secondary | ICD-10-CM

## 2019-05-14 NOTE — Progress Notes (Signed)
   Covid-19 Vaccination Clinic  Name:  REIKA CALLANAN    MRN: [id number] DOB: September 18, 1980  05/14/2019  Ms. Henson was observed post Covid-19 immunization for 15 minutes without incident. She was provided with Vaccine Information Sheet and instruction to access the V-Safe system.   Ms. Clay was instructed to call 911 with any severe reactions post vaccine: Marland Kitchen Difficulty breathing  . Swelling of face and throat  . A fast heartbeat  . A bad rash all over body  . Dizziness and weakness   Immunizations Administered    Name Date Dose VIS Date Route   Moderna COVID-19 Vaccine 05/14/2019  8:42 AM 0.5 mL 01/01/2019 Intramuscular   Manufacturer: Moderna   Lot: [id number]   NDC: 91068-166-19

## 2019-05-29 DIAGNOSIS — E039 Hypothyroidism, unspecified: Secondary | ICD-10-CM | POA: Diagnosis not present

## 2019-05-29 DIAGNOSIS — Z6828 Body mass index (BMI) 28.0-28.9, adult: Secondary | ICD-10-CM | POA: Diagnosis not present

## 2019-05-29 DIAGNOSIS — E7849 Other hyperlipidemia: Secondary | ICD-10-CM | POA: Diagnosis not present

## 2019-05-29 DIAGNOSIS — E119 Type 2 diabetes mellitus without complications: Secondary | ICD-10-CM | POA: Diagnosis not present

## 2019-05-29 DIAGNOSIS — J45909 Unspecified asthma, uncomplicated: Secondary | ICD-10-CM | POA: Diagnosis not present

## 2019-05-29 DIAGNOSIS — I471 Supraventricular tachycardia: Secondary | ICD-10-CM | POA: Diagnosis not present

## 2019-11-06 ENCOUNTER — Other Ambulatory Visit: Payer: Self-pay | Admitting: Adult Health

## 2019-11-20 DIAGNOSIS — E1165 Type 2 diabetes mellitus with hyperglycemia: Secondary | ICD-10-CM | POA: Diagnosis not present

## 2019-11-20 DIAGNOSIS — E0591 Thyrotoxicosis, unspecified with thyrotoxic crisis or storm: Secondary | ICD-10-CM | POA: Diagnosis not present

## 2019-11-20 DIAGNOSIS — E6609 Other obesity due to excess calories: Secondary | ICD-10-CM | POA: Diagnosis not present

## 2019-11-20 DIAGNOSIS — E0500 Thyrotoxicosis with diffuse goiter without thyrotoxic crisis or storm: Secondary | ICD-10-CM | POA: Diagnosis not present

## 2019-11-20 DIAGNOSIS — E890 Postprocedural hypothyroidism: Secondary | ICD-10-CM | POA: Diagnosis not present

## 2019-11-27 DIAGNOSIS — E890 Postprocedural hypothyroidism: Secondary | ICD-10-CM | POA: Diagnosis not present

## 2019-11-27 DIAGNOSIS — E0591 Thyrotoxicosis, unspecified with thyrotoxic crisis or storm: Secondary | ICD-10-CM | POA: Diagnosis not present

## 2019-11-27 DIAGNOSIS — E1165 Type 2 diabetes mellitus with hyperglycemia: Secondary | ICD-10-CM | POA: Diagnosis not present

## 2020-02-26 DIAGNOSIS — I471 Supraventricular tachycardia: Secondary | ICD-10-CM | POA: Diagnosis not present

## 2020-02-26 DIAGNOSIS — E039 Hypothyroidism, unspecified: Secondary | ICD-10-CM | POA: Diagnosis not present

## 2020-02-26 DIAGNOSIS — J45909 Unspecified asthma, uncomplicated: Secondary | ICD-10-CM | POA: Diagnosis not present

## 2020-02-26 DIAGNOSIS — E118 Type 2 diabetes mellitus with unspecified complications: Secondary | ICD-10-CM | POA: Diagnosis not present

## 2020-02-26 DIAGNOSIS — Z6827 Body mass index (BMI) 27.0-27.9, adult: Secondary | ICD-10-CM | POA: Diagnosis not present

## 2020-02-26 DIAGNOSIS — E7849 Other hyperlipidemia: Secondary | ICD-10-CM | POA: Diagnosis not present

## 2020-02-27 ENCOUNTER — Other Ambulatory Visit: Payer: Self-pay | Admitting: Obstetrics & Gynecology

## 2020-03-11 IMAGING — DX DG CHEST 1V PORT
1 series · 1 of 1 positions shown · non-contrast
Comparison: No prior.

CLINICAL DATA: Chest pain.

EXAM:
PORTABLE CHEST 1 VIEW

[chest ap]
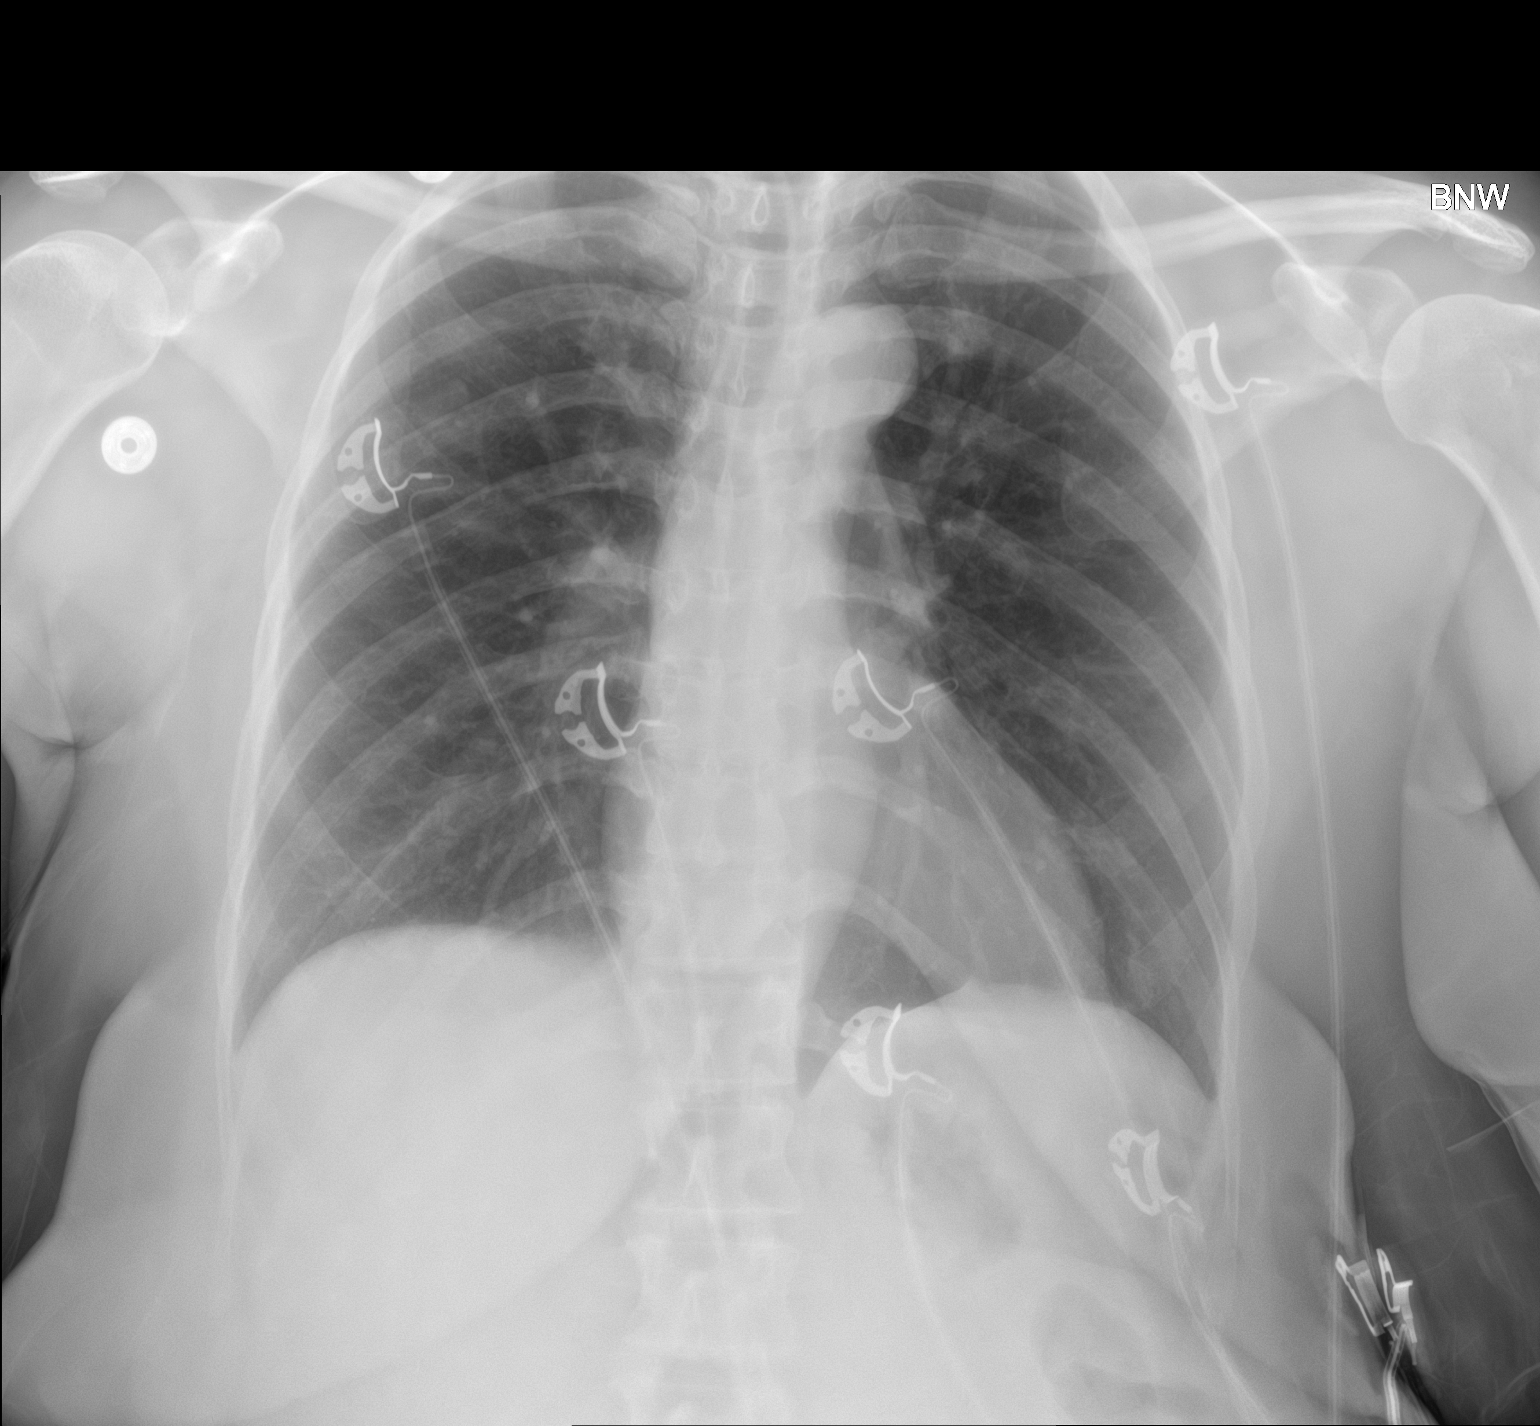

[1 of 1 positions shown; findings below may reference images not displayed]

FINDINGS: Mediastinum hilar structures normal. Mild left base subsegmental
atelectasis. No pleural effusion or pneumothorax. Heart size normal.
No

Acute bony abnormality.
IMPRESSION: Mild left base subsegmental atelectasis otherwise negative exam.

## 2020-04-02 ENCOUNTER — Ambulatory Visit (INDEPENDENT_AMBULATORY_CARE_PROVIDER_SITE_OTHER): Payer: BC Managed Care – PPO | Admitting: Adult Health

## 2020-04-02 ENCOUNTER — Encounter: Payer: Self-pay | Admitting: Adult Health

## 2020-04-02 ENCOUNTER — Other Ambulatory Visit: Payer: Self-pay

## 2020-04-02 VITALS — BP 140/88 | HR 93 | Ht <= 58 in | Wt 125.0 lb

## 2020-04-02 DIAGNOSIS — Z3041 Encounter for surveillance of contraceptive pills: Secondary | ICD-10-CM | POA: Diagnosis not present

## 2020-04-02 DIAGNOSIS — R030 Elevated blood-pressure reading, without diagnosis of hypertension: Secondary | ICD-10-CM | POA: Diagnosis not present

## 2020-04-02 DIAGNOSIS — Z01419 Encounter for gynecological examination (general) (routine) without abnormal findings: Secondary | ICD-10-CM | POA: Diagnosis not present

## 2020-04-02 MED ORDER — NORGESTIM-ETH ESTRAD TRIPHASIC 0.18/0.215/0.25 MG-35 MCG PO TABS: 1.0000 | ORAL_TABLET | Freq: Every day | ORAL | 12 refills | Status: DC

## 2020-04-02 NOTE — Patient Instructions (Signed)

## 2020-04-02 NOTE — Progress Notes (Signed)
Patient ID: Christy Harmon, female   DOB: Dec 04, 1980, 40 y.o.   MRN: [id number] History of Present Illness: Christy Harmon is a 40 year old black female, married, G1P1 in for well woman gyn exam,she had a normal pap with negative HPV 03/01/19. PCP is Dr Phillips Odor.   Current Medications, Allergies, Past Medical History, Past Surgical History, Family History and Social History were reviewed in [employer] record.     Review of Systems:  Patient denies any headaches, hearing loss, fatigue, blurred vision, shortness of breath, chest pain, abdominal pain, problems with bowel movements, urination, or intercourse. No joint pain or mood swings. Periods are good on OCs.  Physical Exam:BP 140/88 (BP Location: Left Arm, Cuff Size: Normal)   Pulse 93   Ht 4\' 8"  (1.422 m)   Wt 125 lb (56.7 kg)   LMP 03/26/2020   BMI 28.02 kg/m  General:  Well developed, well nourished, no acute distress Skin:  Warm and dry Neck:  Midline trachea, normal thyroid, good ROM, no lymphadenopathy Lungs; Clear to auscultation bilaterally Breast:  No dominant palpable mass, retraction, or nipple discharge Cardiovascular: Regular rate and rhythm Abdomen:  Soft, non tender, no hepatosplenomegaly Pelvic:  External genitalia is normal in appearance, no lesions.  The vagina is normal in appearance. Urethra has no lesions or masses. The cervix is bulbous.  Uterus is felt to be normal size, shape, and contour.  No adnexal masses or tenderness noted.Bladder is non tender, no masses felt. Extremities/musculoskeletal:  No swelling or varicosities noted, no clubbing or cyanosis Psych:  No mood changes, alert and cooperative,seems happy AA is 0  Fall risk is low PHQ 9 score is 0 GAD 7 score is 0  Upstream - 04/02/20 1020      Pregnancy Intention Screening   Does the patient want to become pregnant in the next year? No    Does the patient's partner want to become pregnant in the next year? No    Would the patient  like to discuss contraceptive options today? No      Contraception Wrap Up   Current Method Oral Contraceptive    End Method Oral Contraceptive    Contraception Counseling Provided No         Examination chaperoned by 06/02/20.  Impression and Plan: 1. Encounter for well woman exam with routine gynecological exam Physical in 1 year Pap in 2024 Mammogram starting next year  2. Elevated BP without diagnosis of hypertension Keep check on BP at home  Review DASH diet Follow up with PCP  3. Encounter for surveillance of contraceptive pills Will refill tri sprintec  Meds ordered this encounter  Medications  . Norgestimate-Ethinyl Estradiol Triphasic (TRI-SPRINTEC) 0.18/0.215/0.25 MG-35 MCG tablet    Sig: Take 1 tablet by mouth daily.    Dispense:  28 tablet    Refill:  12    Order Specific Question:   Supervising Provider    Answer:   02-28-1980 H [[id number]]

## 2020-06-02 DIAGNOSIS — E119 Type 2 diabetes mellitus without complications: Secondary | ICD-10-CM | POA: Diagnosis not present

## 2020-11-18 DIAGNOSIS — E1165 Type 2 diabetes mellitus with hyperglycemia: Secondary | ICD-10-CM | POA: Diagnosis not present

## 2020-11-18 DIAGNOSIS — E0500 Thyrotoxicosis with diffuse goiter without thyrotoxic crisis or storm: Secondary | ICD-10-CM | POA: Diagnosis not present

## 2020-11-18 DIAGNOSIS — E0591 Thyrotoxicosis, unspecified with thyrotoxic crisis or storm: Secondary | ICD-10-CM | POA: Diagnosis not present

## 2020-11-18 DIAGNOSIS — E890 Postprocedural hypothyroidism: Secondary | ICD-10-CM | POA: Diagnosis not present

## 2020-11-25 DIAGNOSIS — Z6830 Body mass index (BMI) 30.0-30.9, adult: Secondary | ICD-10-CM | POA: Diagnosis not present

## 2020-11-25 DIAGNOSIS — F064 Anxiety disorder due to known physiological condition: Secondary | ICD-10-CM | POA: Diagnosis not present

## 2020-11-25 DIAGNOSIS — E1165 Type 2 diabetes mellitus with hyperglycemia: Secondary | ICD-10-CM | POA: Diagnosis not present

## 2020-11-25 DIAGNOSIS — E6609 Other obesity due to excess calories: Secondary | ICD-10-CM | POA: Diagnosis not present

## 2020-11-25 DIAGNOSIS — E0591 Thyrotoxicosis, unspecified with thyrotoxic crisis or storm: Secondary | ICD-10-CM | POA: Diagnosis not present

## 2020-11-25 DIAGNOSIS — Z7189 Other specified counseling: Secondary | ICD-10-CM | POA: Diagnosis not present

## 2020-11-25 DIAGNOSIS — E890 Postprocedural hypothyroidism: Secondary | ICD-10-CM | POA: Diagnosis not present

## 2021-03-17 DIAGNOSIS — E039 Hypothyroidism, unspecified: Secondary | ICD-10-CM | POA: Diagnosis not present

## 2021-03-17 DIAGNOSIS — E663 Overweight: Secondary | ICD-10-CM | POA: Diagnosis not present

## 2021-03-17 DIAGNOSIS — Z6829 Body mass index (BMI) 29.0-29.9, adult: Secondary | ICD-10-CM | POA: Diagnosis not present

## 2021-03-17 DIAGNOSIS — E118 Type 2 diabetes mellitus with unspecified complications: Secondary | ICD-10-CM | POA: Diagnosis not present

## 2021-03-17 DIAGNOSIS — E7849 Other hyperlipidemia: Secondary | ICD-10-CM | POA: Diagnosis not present

## 2021-03-17 DIAGNOSIS — I471 Supraventricular tachycardia: Secondary | ICD-10-CM | POA: Diagnosis not present

## 2021-03-17 DIAGNOSIS — Z1331 Encounter for screening for depression: Secondary | ICD-10-CM | POA: Diagnosis not present

## 2021-03-17 DIAGNOSIS — Z0001 Encounter for general adult medical examination with abnormal findings: Secondary | ICD-10-CM | POA: Diagnosis not present

## 2021-03-17 DIAGNOSIS — J45909 Unspecified asthma, uncomplicated: Secondary | ICD-10-CM | POA: Diagnosis not present

## 2021-03-17 DIAGNOSIS — E782 Mixed hyperlipidemia: Secondary | ICD-10-CM | POA: Diagnosis not present

## 2021-05-05 ENCOUNTER — Other Ambulatory Visit: Payer: Self-pay | Admitting: Adult Health

## 2021-06-21 ENCOUNTER — Other Ambulatory Visit: Payer: Self-pay | Admitting: Adult Health

## 2021-07-14 DIAGNOSIS — E039 Hypothyroidism, unspecified: Secondary | ICD-10-CM | POA: Diagnosis not present

## 2021-07-14 DIAGNOSIS — E6609 Other obesity due to excess calories: Secondary | ICD-10-CM | POA: Diagnosis not present

## 2021-07-14 DIAGNOSIS — Z6829 Body mass index (BMI) 29.0-29.9, adult: Secondary | ICD-10-CM | POA: Diagnosis not present

## 2021-07-14 DIAGNOSIS — E782 Mixed hyperlipidemia: Secondary | ICD-10-CM | POA: Diagnosis not present

## 2021-07-14 DIAGNOSIS — E118 Type 2 diabetes mellitus with unspecified complications: Secondary | ICD-10-CM | POA: Diagnosis not present

## 2021-07-14 DIAGNOSIS — I471 Supraventricular tachycardia: Secondary | ICD-10-CM | POA: Diagnosis not present

## 2021-09-10 ENCOUNTER — Other Ambulatory Visit: Payer: Self-pay | Admitting: Adult Health

## 2021-11-10 DIAGNOSIS — E663 Overweight: Secondary | ICD-10-CM | POA: Diagnosis not present

## 2021-11-10 DIAGNOSIS — E039 Hypothyroidism, unspecified: Secondary | ICD-10-CM | POA: Diagnosis not present

## 2021-11-10 DIAGNOSIS — E7849 Other hyperlipidemia: Secondary | ICD-10-CM | POA: Diagnosis not present

## 2021-11-10 DIAGNOSIS — E782 Mixed hyperlipidemia: Secondary | ICD-10-CM | POA: Diagnosis not present

## 2021-11-10 DIAGNOSIS — Z6828 Body mass index (BMI) 28.0-28.9, adult: Secondary | ICD-10-CM | POA: Diagnosis not present

## 2021-11-10 DIAGNOSIS — E119 Type 2 diabetes mellitus without complications: Secondary | ICD-10-CM | POA: Diagnosis not present

## 2021-11-22 DIAGNOSIS — K219 Gastro-esophageal reflux disease without esophagitis: Secondary | ICD-10-CM | POA: Diagnosis not present

## 2021-11-22 DIAGNOSIS — E0591 Thyrotoxicosis, unspecified with thyrotoxic crisis or storm: Secondary | ICD-10-CM | POA: Diagnosis not present

## 2021-11-22 DIAGNOSIS — E890 Postprocedural hypothyroidism: Secondary | ICD-10-CM | POA: Diagnosis not present

## 2021-11-22 DIAGNOSIS — E0500 Thyrotoxicosis with diffuse goiter without thyrotoxic crisis or storm: Secondary | ICD-10-CM | POA: Diagnosis not present

## 2021-11-29 DIAGNOSIS — E890 Postprocedural hypothyroidism: Secondary | ICD-10-CM | POA: Diagnosis not present

## 2021-11-29 DIAGNOSIS — E0591 Thyrotoxicosis, unspecified with thyrotoxic crisis or storm: Secondary | ICD-10-CM | POA: Diagnosis not present

## 2021-11-29 DIAGNOSIS — E1165 Type 2 diabetes mellitus with hyperglycemia: Secondary | ICD-10-CM | POA: Diagnosis not present

## 2022-02-11 DIAGNOSIS — E1165 Type 2 diabetes mellitus with hyperglycemia: Secondary | ICD-10-CM | POA: Diagnosis not present

## 2022-02-11 DIAGNOSIS — Z6826 Body mass index (BMI) 26.0-26.9, adult: Secondary | ICD-10-CM | POA: Diagnosis not present

## 2022-02-11 DIAGNOSIS — E782 Mixed hyperlipidemia: Secondary | ICD-10-CM | POA: Diagnosis not present

## 2022-02-11 DIAGNOSIS — E663 Overweight: Secondary | ICD-10-CM | POA: Diagnosis not present

## 2022-02-11 DIAGNOSIS — E039 Hypothyroidism, unspecified: Secondary | ICD-10-CM | POA: Diagnosis not present

## 2022-02-17 ENCOUNTER — Encounter: Payer: Self-pay | Admitting: Adult Health

## 2022-02-17 ENCOUNTER — Ambulatory Visit (INDEPENDENT_AMBULATORY_CARE_PROVIDER_SITE_OTHER): Payer: BC Managed Care – PPO | Admitting: Adult Health

## 2022-02-17 ENCOUNTER — Other Ambulatory Visit (HOSPITAL_COMMUNITY): Payer: Self-pay | Admitting: Adult Health

## 2022-02-17 ENCOUNTER — Other Ambulatory Visit (HOSPITAL_COMMUNITY)
Admission: RE | Admit: 2022-02-17 | Discharge: 2022-02-17 | Disposition: A | Discharge status: Home / Self Care | Payer: BC Managed Care – PPO | Source: Ambulatory Visit | Attending: Obstetrics & Gynecology | Admitting: Obstetrics & Gynecology

## 2022-02-17 VITALS — BP 122/86 | HR 97 | Ht <= 58 in | Wt 118.0 lb

## 2022-02-17 DIAGNOSIS — Z01419 Encounter for gynecological examination (general) (routine) without abnormal findings: Secondary | ICD-10-CM | POA: Insufficient documentation

## 2022-02-17 DIAGNOSIS — Z1231 Encounter for screening mammogram for malignant neoplasm of breast: Secondary | ICD-10-CM

## 2022-02-17 DIAGNOSIS — Z3041 Encounter for surveillance of contraceptive pills: Secondary | ICD-10-CM

## 2022-02-17 DIAGNOSIS — Z3202 Encounter for pregnancy test, result negative: Secondary | ICD-10-CM

## 2022-02-17 DIAGNOSIS — Z1211 Encounter for screening for malignant neoplasm of colon: Secondary | ICD-10-CM

## 2022-02-17 LAB — HEMOCCULT GUIAC POC 1CARD (OFFICE): Fecal Occult Blood, POC: NEGATIVE

## 2022-02-17 LAB — POCT URINE PREGNANCY: Preg Test, Ur: NEGATIVE

## 2022-02-17 MED ORDER — NORGESTIM-ETH ESTRAD TRIPHASIC 0.18/0.215/0.25 MG-35 MCG PO TABS: 1.0000 | ORAL_TABLET | Freq: Every day | ORAL | 3 refills | Status: DC

## 2022-02-17 NOTE — Progress Notes (Signed)
Patient ID: Christy Harmon, female   DOB: [birth year], 42 y.o.   MRN: [id number] History of Present Illness: Christy Harmon is a 42 year old black female,married, G1P1001, in for a well woman gyn exam and pap.   PCP is Dr Hilma Favors   Current Medications, Allergies, Past Medical History, Past Surgical History, Family History and Social History were reviewed in [place] record.     Review of Systems: Patient denies any headaches, hearing loss, fatigue, blurred vision, shortness of breath, chest pain, abdominal pain, problems with bowel movements, urination, or intercourse. No joint pain or mood swings.  She says she gets nervous when comes for doctor visit    Physical Exam:BP 122/86 (BP Location: Left Arm, Patient Position: Sitting, Cuff Size: Normal)   Pulse 97   Ht 4\' 9"  (1.448 m)   Wt 118 lb (53.5 kg)   LMP 02/09/2022 (Approximate)   BMI 25.53 kg/m  UPT is negative   General:  Well developed, well nourished, no acute distress Skin:  Warm and dry Neck:  Midline trachea, normal thyroid, good ROM, no lymphadenopathy Lungs; Clear to auscultation bilaterally Breast:  No dominant palpable mass, retraction, or nipple discharge Cardiovascular: Regular rate and rhythm Abdomen:  Soft, non tender, no hepatosplenomegaly Pelvic:  External genitalia is normal in appearance, no lesions.  The vagina is normal in appearance. Urethra has no lesions or masses. The cervix is bulbous, and smooth, pap with GC/CHL, and HR HPV genotyping performed.  Uterus is felt to be normal size, shape, and contour.  No adnexal masses or tenderness noted.Bladder is non tender, no masses felt. Rectal: Good sphincter tone, no polyps, or hemorrhoids felt.  Hemoccult negative. Extremities/musculoskeletal:  No swelling or varicosities noted, no clubbing or cyanosis Psych:  No mood changes, alert and cooperative,seems happy AA is 0 Fall risk is low    02/17/2022    9:39 AM 04/02/2020   10:23 AM 03/01/2019     9:25 AM  Depression screen PHQ 2/9  Decreased Interest 0 0 0  Down, Depressed, Hopeless 0 0 0  PHQ - 2 Score 0 0 0  Altered sleeping 0 0   Tired, decreased energy 0 0   Change in appetite 0 0   Feeling bad or failure about yourself  0 0   Trouble concentrating 0 0   Moving slowly or fidgety/restless 0 0   Suicidal thoughts 0 0   PHQ-9 Score 0 0        02/17/2022    9:39 AM 04/02/2020   10:23 AM  GAD 7 : Generalized Anxiety Score  Nervous, Anxious, on Edge 0 0  Control/stop worrying 0 0  Worry too much - different things 0 0  Trouble relaxing 0 0  Restless 0 0  Easily annoyed or irritable 0 0  Afraid - awful might happen 0 0  Total GAD 7 Score 0 0      Upstream - 02/17/22 0948       Pregnancy Intention Screening   Does the patient want to become pregnant in the next year? No    Does the patient's partner want to become pregnant in the next year? No    Would the patient like to discuss contraceptive options today? No      Contraception Wrap Up   Current Method Oral Contraceptive    End Method Oral Contraceptive            Examination chaperoned by Levy Pupa LPN   Impression and Plan:  1. Pregnancy examination or test, negative result  2. Encounter for gynecological examination with Papanicolaou smear of cervix Pap sent Pap in 3 years if negative Physical in 1 year Labs with PCP   3. Encounter for screening fecal occult blood testing Hemoccult was negative   4. Encounter for surveillance of contraceptive pills Happy with Tri Sprintec.   Meds ordered this encounter  Medications   Norgestimate-Ethinyl Estradiol Triphasic (TRI-SPRINTEC) 0.18/0.215/0.25 MG-35 MCG tablet    Sig: Take 1 tablet by mouth daily.    Dispense:  84 tablet    Refill:  3    Order Specific Question:   Supervising Provider    Answer:   Tania Ade H [[id number]]

## 2022-02-18 LAB — CYTOLOGY - PAP
Chlamydia: NEGATIVE
Comment: NEGATIVE
Comment: NEGATIVE
Comment: NORMAL
Diagnosis: NEGATIVE
High risk HPV: NEGATIVE
Neisseria Gonorrhea: NEGATIVE

## 2022-02-24 ENCOUNTER — Ambulatory Visit (HOSPITAL_COMMUNITY): Payer: BC Managed Care – PPO

## 2022-02-25 ENCOUNTER — Ambulatory Visit (HOSPITAL_COMMUNITY)
Admission: RE | Admit: 2022-02-25 | Discharge: 2022-02-25 | Disposition: A | Discharge status: Home / Self Care | Payer: BC Managed Care – PPO | Source: Ambulatory Visit | Attending: Adult Health | Admitting: Adult Health

## 2022-02-25 ENCOUNTER — Encounter (HOSPITAL_COMMUNITY): Payer: Self-pay

## 2022-02-25 DIAGNOSIS — Z1231 Encounter for screening mammogram for malignant neoplasm of breast: Secondary | ICD-10-CM

## 2022-05-17 DIAGNOSIS — E663 Overweight: Secondary | ICD-10-CM | POA: Diagnosis not present

## 2022-05-17 DIAGNOSIS — Z6826 Body mass index (BMI) 26.0-26.9, adult: Secondary | ICD-10-CM | POA: Diagnosis not present

## 2022-05-17 DIAGNOSIS — E1165 Type 2 diabetes mellitus with hyperglycemia: Secondary | ICD-10-CM | POA: Diagnosis not present

## 2022-05-17 DIAGNOSIS — E782 Mixed hyperlipidemia: Secondary | ICD-10-CM | POA: Diagnosis not present

## 2022-05-17 DIAGNOSIS — E039 Hypothyroidism, unspecified: Secondary | ICD-10-CM | POA: Diagnosis not present

## 2022-05-17 DIAGNOSIS — Z1331 Encounter for screening for depression: Secondary | ICD-10-CM | POA: Diagnosis not present

## 2022-05-17 DIAGNOSIS — Z0001 Encounter for general adult medical examination with abnormal findings: Secondary | ICD-10-CM | POA: Diagnosis not present

## 2022-05-23 DIAGNOSIS — E785 Hyperlipidemia, unspecified: Secondary | ICD-10-CM | POA: Diagnosis not present

## 2022-05-23 DIAGNOSIS — Z0001 Encounter for general adult medical examination with abnormal findings: Secondary | ICD-10-CM | POA: Diagnosis not present

## 2022-05-23 DIAGNOSIS — E1165 Type 2 diabetes mellitus with hyperglycemia: Secondary | ICD-10-CM | POA: Diagnosis not present

## 2022-05-23 DIAGNOSIS — Z029 Encounter for administrative examinations, unspecified: Secondary | ICD-10-CM | POA: Diagnosis not present

## 2022-05-23 DIAGNOSIS — E7849 Other hyperlipidemia: Secondary | ICD-10-CM | POA: Diagnosis not present

## 2022-05-23 DIAGNOSIS — E0500 Thyrotoxicosis with diffuse goiter without thyrotoxic crisis or storm: Secondary | ICD-10-CM | POA: Diagnosis not present

## 2022-05-23 DIAGNOSIS — E0591 Thyrotoxicosis, unspecified with thyrotoxic crisis or storm: Secondary | ICD-10-CM | POA: Diagnosis not present

## 2022-05-23 DIAGNOSIS — E890 Postprocedural hypothyroidism: Secondary | ICD-10-CM | POA: Diagnosis not present

## 2022-11-11 ENCOUNTER — Other Ambulatory Visit (HOSPITAL_COMMUNITY): Payer: Self-pay | Admitting: Family Medicine

## 2022-11-11 DIAGNOSIS — Z23 Encounter for immunization: Secondary | ICD-10-CM | POA: Diagnosis not present

## 2022-11-11 DIAGNOSIS — E1165 Type 2 diabetes mellitus with hyperglycemia: Secondary | ICD-10-CM | POA: Diagnosis not present

## 2022-11-11 DIAGNOSIS — E782 Mixed hyperlipidemia: Secondary | ICD-10-CM | POA: Diagnosis not present

## 2022-11-11 DIAGNOSIS — E039 Hypothyroidism, unspecified: Secondary | ICD-10-CM | POA: Diagnosis not present

## 2022-11-11 DIAGNOSIS — Z6827 Body mass index (BMI) 27.0-27.9, adult: Secondary | ICD-10-CM | POA: Diagnosis not present

## 2022-11-11 DIAGNOSIS — E663 Overweight: Secondary | ICD-10-CM | POA: Diagnosis not present

## 2022-11-11 DIAGNOSIS — Z0001 Encounter for general adult medical examination with abnormal findings: Secondary | ICD-10-CM | POA: Diagnosis not present

## 2022-11-11 DIAGNOSIS — E7849 Other hyperlipidemia: Secondary | ICD-10-CM | POA: Diagnosis not present

## 2022-11-23 ENCOUNTER — Encounter (HOSPITAL_COMMUNITY): Payer: Self-pay

## 2022-11-23 ENCOUNTER — Ambulatory Visit (HOSPITAL_COMMUNITY): Payer: BC Managed Care – PPO

## 2023-01-06 ENCOUNTER — Other Ambulatory Visit: Payer: Self-pay | Admitting: Adult Health

## 2023-03-20 ENCOUNTER — Ambulatory Visit: Payer: BC Managed Care – PPO | Admitting: Adult Health

## 2023-03-20 ENCOUNTER — Other Ambulatory Visit (HOSPITAL_COMMUNITY): Payer: Self-pay | Admitting: Adult Health

## 2023-03-20 ENCOUNTER — Encounter: Payer: Self-pay | Admitting: Adult Health

## 2023-03-20 VITALS — BP 133/93 | HR 101 | Ht <= 58 in | Wt 125.0 lb

## 2023-03-20 DIAGNOSIS — Z3202 Encounter for pregnancy test, result negative: Secondary | ICD-10-CM | POA: Diagnosis not present

## 2023-03-20 DIAGNOSIS — Z1331 Encounter for screening for depression: Secondary | ICD-10-CM

## 2023-03-20 DIAGNOSIS — Z3041 Encounter for surveillance of contraceptive pills: Secondary | ICD-10-CM

## 2023-03-20 DIAGNOSIS — Z1211 Encounter for screening for malignant neoplasm of colon: Secondary | ICD-10-CM

## 2023-03-20 DIAGNOSIS — R030 Elevated blood-pressure reading, without diagnosis of hypertension: Secondary | ICD-10-CM | POA: Diagnosis not present

## 2023-03-20 DIAGNOSIS — Z01419 Encounter for gynecological examination (general) (routine) without abnormal findings: Secondary | ICD-10-CM | POA: Diagnosis not present

## 2023-03-20 DIAGNOSIS — Z1231 Encounter for screening mammogram for malignant neoplasm of breast: Secondary | ICD-10-CM

## 2023-03-20 LAB — HEMOCCULT GUIAC POC 1CARD (OFFICE): Fecal Occult Blood, POC: NEGATIVE

## 2023-03-20 LAB — POCT URINE PREGNANCY: Preg Test, Ur: NEGATIVE

## 2023-03-20 MED ORDER — NORGESTIM-ETH ESTRAD TRIPHASIC 0.18/0.215/0.25 MG-35 MCG PO TABS: 1.0000 | ORAL_TABLET | Freq: Every day | ORAL | 3 refills | Status: AC

## 2023-03-20 MED ORDER — HYDROCHLOROTHIAZIDE 12.5 MG PO CAPS: 12.5000 mg | ORAL_CAPSULE | Freq: Every day | ORAL | 6 refills | Status: AC

## 2023-03-20 NOTE — Patient Instructions (Signed)
 Keep check on BP at hone Decrease salt and sugars Try walk 30 minutes Follow up with me in about 9 weeks for BP check and ROS

## 2023-03-20 NOTE — Progress Notes (Signed)
 Patient ID: Christy Harmon, female   DOB: January 03, 1981, 43 y.o.   MRN: [id number] History of Present Illness: Christy Harmon is a 43 year old black female, married, G1P1001, in for a well woman gyn exam.     Component Value Date/Time   DIAGPAP  02/17/2022 0956    - Negative for intraepithelial lesion or malignancy (NILM)   DIAGPAP  03/01/2019 0915    - Negative for intraepithelial lesion or malignancy (NILM)   HPVHIGH Negative 02/17/2022 0956   HPVHIGH Negative 03/01/2019 0915   ADEQPAP  02/17/2022 0956    Satisfactory for evaluation; transformation zone component PRESENT.   ADEQPAP  03/01/2019 0915    Satisfactory for evaluation; transformation zone component PRESENT.    PCP is Dr Phillips Odor   Current Medications, Allergies, Past Medical History, Past Surgical History, Family History and Social History were reviewed in [employer] [facility] electronic medical record.     Review of Systems: Patient denies any headaches, hearing loss, fatigue, blurred vision, shortness of breath, chest pain, abdominal pain, problems with bowel movements, urination, or intercourse. No joint pain or mood swings.     Physical Exam:BP (!) 133/93 (BP Location: Right Arm, Patient Position: Sitting, Cuff Size: Normal)   Pulse (!) 101   Ht 4\' 7"  (1.397 m)   Wt 125 lb (56.7 kg)   LMP 03/08/2023 (Approximate)   BMI 29.05 kg/m  UP T is negative  General:  Well developed, well nourished, no acute distress Skin:  Warm and dry Neck:  Midline trachea, normal thyroid, good ROM, no lymphadenopathy Lungs; Clear to auscultation bilaterally Breast:  No dominant palpable mass, retraction, or nipple discharge Cardiovascular: Regular rate and rhythm Abdomen:  Soft, non tender, no hepatosplenomegaly Pelvic:  External genitalia is normal in appearance, no lesions.  The vagina is normal in appearance. Urethra has no lesions or masses. The cervix is bulbous.  Uterus is felt to be normal size, shape, and contour.  No adnexal masses or  tenderness noted.Bladder is non tender, no masses felt. Rectal: Good sphincter tone, no polyps, or hemorrhoids felt.  Hemoccult negative. Extremities/musculoskeletal:  No swelling or varicosities noted, no clubbing or cyanosis Psych:  No mood changes, alert and cooperative,seems happy AA is 0 Fall risk is low    03/20/2023    8:42 AM 02/17/2022    9:39 AM 04/02/2020   10:23 AM  Depression screen PHQ 2/9  Decreased Interest 0 0 0  Down, Depressed, Hopeless 0 0 0  PHQ - 2 Score 0 0 0  Altered sleeping 0 0 0  Tired, decreased energy 0 0 0  Change in appetite 0 0 0  Feeling bad or failure about yourself  0 0 0  Trouble concentrating 0 0 0  Moving slowly or fidgety/restless 0 0 0  Suicidal thoughts 0 0 0  PHQ-9 Score 0 0 0       03/20/2023    8:43 AM 02/17/2022    9:39 AM 04/02/2020   10:23 AM  GAD 7 : Generalized Anxiety Score  Nervous, Anxious, on Edge 0 0 0  Control/stop worrying 0 0 0  Worry too much - different things 0 0 0  Trouble relaxing 0 0 0  Restless 0 0 0  Easily annoyed or irritable 0 0 0  Afraid - awful might happen 0 0 0  Total GAD 7 Score 0 0 0      Upstream - 03/20/23 0842       Pregnancy Intention Screening   Does the patient  want to become pregnant in the next year? No    Does the patient's partner want to become pregnant in the next year? No    Would the patient like to discuss contraceptive options today? No      Contraception Wrap Up   Current Method Oral Contraceptive    End Method Oral Contraceptive    Contraception Counseling Provided No            Examination chaperoned by Faith Rogue LPN   Impression and Plan: 1. Pregnancy test negative - POCT urine pregnancy  2. Encounter for screening fecal occult blood testing Hemoccult was negative  - POCT occult blood stool  3. Encounter for surveillance of contraceptive pills Happy with BCP, will refill tri sprintec Meds ordered this encounter  Medications   hydrochlorothiazide  (MICROZIDE) 12.5 MG capsule    Sig: Take 1 capsule (12.5 mg total) by mouth daily.    Dispense:  30 capsule    Refill:  6    Supervising Provider:   Duane Lope H [[id number]]   Norgestimate-Ethinyl Estradiol Triphasic (TRI-SPRINTEC) 0.18/0.215/0.25 MG-35 MCG tablet    Sig: Take 1 tablet by mouth daily.    Dispense:  84 tablet    Refill:  3    Supervising Provider:   Despina Hidden, LUTHER H [[id number]]     4. Elevated BP without diagnosis of hypertension BP up and down at home Will rx Microzide 12.5 mg 1 daily Decrease salt and sugars Review DASH diet Follow up in 9 weeks on BP and see PCP as scheduled   5. Encounter for well woman exam with routine gynecological exam (Primary) Physical in 1 year Pap in 2027 Mammogram was negative 02/25/22 Labs with PCP

## 2023-03-29 ENCOUNTER — Ambulatory Visit (HOSPITAL_COMMUNITY): Payer: BC Managed Care – PPO

## 2023-04-05 ENCOUNTER — Encounter (HOSPITAL_COMMUNITY): Payer: Self-pay

## 2023-04-05 ENCOUNTER — Ambulatory Visit (HOSPITAL_COMMUNITY)
Admission: RE | Admit: 2023-04-05 | Discharge: 2023-04-05 | Disposition: A | Discharge status: Home / Self Care | Payer: BC Managed Care – PPO | Source: Ambulatory Visit | Attending: Adult Health | Admitting: Adult Health

## 2023-04-05 DIAGNOSIS — Z1231 Encounter for screening mammogram for malignant neoplasm of breast: Secondary | ICD-10-CM | POA: Diagnosis not present

## 2023-05-22 ENCOUNTER — Encounter: Payer: Self-pay | Admitting: Adult Health

## 2023-05-22 ENCOUNTER — Ambulatory Visit: Payer: BC Managed Care – PPO | Admitting: Adult Health

## 2023-05-22 VITALS — BP 118/80 | HR 89 | Ht <= 58 in | Wt 124.0 lb

## 2023-05-22 DIAGNOSIS — I10 Essential (primary) hypertension: Secondary | ICD-10-CM

## 2023-05-22 NOTE — Progress Notes (Signed)
  Subjective:     Patient ID: Christy Harmon, female   DOB: 09/25/1980, 43 y.o.   MRN: [id number]  HPI Christy Harmon is 43 year old black female,married, G1P1001 in for a BP check after starting Microzide  in February. She says she feels better.    Component Value Date/Time   DIAGPAP  02/17/2022 0956    - Negative for intraepithelial lesion or malignancy (NILM)   DIAGPAP  03/01/2019 0915    - Negative for intraepithelial lesion or malignancy (NILM)   HPVHIGH Negative 02/17/2022 0956   HPVHIGH Negative 03/01/2019 0915   ADEQPAP  02/17/2022 0956    Satisfactory for evaluation; transformation zone component PRESENT.   ADEQPAP  03/01/2019 0915    Satisfactory for evaluation; transformation zone component PRESENT.    PCP is Dr Glady Laming  Review of Systems She says she feels better No complaints  Reviewed past medical,surgical, social and family history. Reviewed medications and allergies.     Objective:   Physical Exam BP 118/80 (BP Location: Left Arm, Patient Position: Sitting, Cuff Size: Normal)   Pulse 89   Ht 4\' 7"  (1.397 m)   Wt 124 lb (56.2 kg)   LMP 05/05/2023 (Approximate)   BMI 28.82 kg/m   has lost a pound.   Skin warm and dry. Lungs: clear to ausculation bilaterally. Cardiovascular: regular rate and rhythm.   Upstream - 05/22/23 1308       Pregnancy Intention Screening   Does the patient want to become pregnant in the next year? No    Does the patient's partner want to become pregnant in the next year? No    Would the patient like to discuss contraceptive options today? No      Contraception Wrap Up   Current Method Oral Contraceptive    End Method Oral Contraceptive    Contraception Counseling Provided Yes             Assessment:     1. Hypertension, unspecified type (Primary) BP is much better She says she feels better and has lost a pound Continue Microzide  12.5 mg 1 daily has refills     Plan:     Follow up in 3 months for BP check and ROS

## 2023-05-24 DIAGNOSIS — Z1331 Encounter for screening for depression: Secondary | ICD-10-CM | POA: Diagnosis not present

## 2023-05-24 DIAGNOSIS — E039 Hypothyroidism, unspecified: Secondary | ICD-10-CM | POA: Diagnosis not present

## 2023-05-24 DIAGNOSIS — Z6828 Body mass index (BMI) 28.0-28.9, adult: Secondary | ICD-10-CM | POA: Diagnosis not present

## 2023-05-24 DIAGNOSIS — E663 Overweight: Secondary | ICD-10-CM | POA: Diagnosis not present

## 2023-05-24 DIAGNOSIS — Z0001 Encounter for general adult medical examination with abnormal findings: Secondary | ICD-10-CM | POA: Diagnosis not present

## 2023-05-24 DIAGNOSIS — Z029 Encounter for administrative examinations, unspecified: Secondary | ICD-10-CM | POA: Diagnosis not present

## 2023-05-24 DIAGNOSIS — E1165 Type 2 diabetes mellitus with hyperglycemia: Secondary | ICD-10-CM | POA: Diagnosis not present

## 2023-06-01 DIAGNOSIS — Z6827 Body mass index (BMI) 27.0-27.9, adult: Secondary | ICD-10-CM | POA: Diagnosis not present

## 2023-06-01 DIAGNOSIS — E663 Overweight: Secondary | ICD-10-CM | POA: Diagnosis not present

## 2023-06-01 DIAGNOSIS — I10 Essential (primary) hypertension: Secondary | ICD-10-CM | POA: Diagnosis not present

## 2023-08-29 ENCOUNTER — Ambulatory Visit: Admitting: Adult Health

## 2023-10-06 DIAGNOSIS — J0100 Acute maxillary sinusitis, unspecified: Secondary | ICD-10-CM | POA: Diagnosis not present

## 2023-11-08 ENCOUNTER — Encounter: Payer: Self-pay | Admitting: *Deleted

## 2023-12-13 DIAGNOSIS — Z23 Encounter for immunization: Secondary | ICD-10-CM | POA: Diagnosis not present

## 2023-12-13 DIAGNOSIS — E039 Hypothyroidism, unspecified: Secondary | ICD-10-CM | POA: Diagnosis not present

## 2023-12-13 DIAGNOSIS — E119 Type 2 diabetes mellitus without complications: Secondary | ICD-10-CM | POA: Diagnosis not present

## 2023-12-13 DIAGNOSIS — I10 Essential (primary) hypertension: Secondary | ICD-10-CM | POA: Diagnosis not present

## 2023-12-13 DIAGNOSIS — E785 Hyperlipidemia, unspecified: Secondary | ICD-10-CM | POA: Diagnosis not present

## 2023-12-13 DIAGNOSIS — J45901 Unspecified asthma with (acute) exacerbation: Secondary | ICD-10-CM | POA: Diagnosis not present

## 2024-04-29 ENCOUNTER — Ambulatory Visit: Admitting: Adult Health
